# Patient Record
Sex: Male | Born: 1955 | Race: White | Hispanic: No | Marital: Married | Smoking: Never smoker
Health system: Southern US, Community
[De-identification: ages and names within clinical notes are randomized; demographics above are authoritative.]

## PROBLEM LIST (undated history)

## (undated) DIAGNOSIS — T7840XA Allergy, unspecified, initial encounter: Secondary | ICD-10-CM

## (undated) DIAGNOSIS — A4902 Methicillin resistant Staphylococcus aureus infection, unspecified site: Secondary | ICD-10-CM

## (undated) DIAGNOSIS — Z8619 Personal history of other infectious and parasitic diseases: Secondary | ICD-10-CM

## (undated) HISTORY — DX: Allergy, unspecified, initial encounter: T78.40XA

## (undated) HISTORY — DX: Personal history of other infectious and parasitic diseases: Z86.19

## (undated) HISTORY — DX: Methicillin resistant Staphylococcus aureus infection, unspecified site: A49.02

---

## 1993-03-12 HISTORY — PX: KNEE SURGERY: SHX244

## 2006-06-10 ENCOUNTER — Ambulatory Visit: Payer: Self-pay | Admitting: Infectious Diseases

## 2010-09-12 LAB — BASIC METABOLIC PANEL
Creatinine: 1 mg/dL (ref 0.6–1.3)
Glucose: 108 mg/dL

## 2010-09-12 LAB — LIPID PANEL: LDL Cholesterol: 110 mg/dL

## 2010-09-12 LAB — TSH: TSH: 3.4 u[IU]/mL (ref <=5.90)

## 2011-01-11 ENCOUNTER — Ambulatory Visit: Payer: Self-pay | Admitting: Family Medicine

## 2011-01-11 ENCOUNTER — Encounter: Payer: Self-pay | Admitting: Family Medicine

## 2011-01-11 ENCOUNTER — Ambulatory Visit (INDEPENDENT_AMBULATORY_CARE_PROVIDER_SITE_OTHER): Payer: PRIVATE HEALTH INSURANCE | Admitting: Family Medicine

## 2011-01-11 VITALS — BP 132/86 | HR 60 | Temp 98.3°F | Ht 71.0 in | Wt 192.1 lb

## 2011-01-11 DIAGNOSIS — Z1211 Encounter for screening for malignant neoplasm of colon: Secondary | ICD-10-CM

## 2011-01-11 DIAGNOSIS — Z0000 Encounter for general adult medical examination without abnormal findings: Secondary | ICD-10-CM | POA: Insufficient documentation

## 2011-01-11 NOTE — Assessment & Plan Note (Signed)
Healthy habits encouraged.  Continue exercise, healthy diet.  D/w patient [id number] for colon cancer screening, including IFOB vs. colonoscopy.  Risks and benefits of both were discussed and patient voiced understanding.  Pt elects for: colonoscopy. PSA prev normal per patient. Will review labs. DRE wnl.  Flu/shingles/td done at work.  Fu prn.

## 2011-01-11 NOTE — Patient Instructions (Addendum)
Drop off a copy of your labs.  I'll take a look at them.  Take care and keep exercising.   See Shirlee Limerick about your referral before your leave today. Glad to see you.

## 2011-01-11 NOTE — Progress Notes (Signed)
New pt.   CPE- See plan.  Routine anticipatory guidance given to patient.  See health maintenance.  Flu/shingles/td done at work.   PMH and SH reviewed  Meds, vitals, and allergies reviewed.   ROS: See HPI.  Otherwise negative.    GEN: nad, alert and oriented HEENT: mucous membranes moist NECK: supple w/o LA CV: rrr. PULM: ctab, no inc wob ABD: soft, +bs EXT: no edema SKIN: no acute rash Prostate gland firm and smooth, no enlargement, nodularity, tenderness, mass, asymmetry or induration.

## 2011-01-13 ENCOUNTER — Telehealth: Payer: Self-pay | Admitting: Family Medicine

## 2011-01-13 NOTE — Telephone Encounter (Signed)
Please call pt.  I checked over his labs.  All are fine except for mild inc in glucose.  I would recheck it yearly, this can be done through his labs at work.  No meds needed, treatment is only diet/exercise.  Thanks.

## 2011-01-15 ENCOUNTER — Encounter: Payer: Self-pay | Admitting: Family Medicine

## 2011-01-15 NOTE — Telephone Encounter (Signed)
Patient advised.

## 2011-06-19 ENCOUNTER — Encounter: Payer: Self-pay | Admitting: Family Medicine

## 2011-06-19 ENCOUNTER — Ambulatory Visit (INDEPENDENT_AMBULATORY_CARE_PROVIDER_SITE_OTHER): Payer: PRIVATE HEALTH INSURANCE | Admitting: Family Medicine

## 2011-06-19 DIAGNOSIS — H6990 Unspecified Eustachian tube disorder, unspecified ear: Secondary | ICD-10-CM

## 2011-06-19 DIAGNOSIS — H6980 Other specified disorders of Eustachian tube, unspecified ear: Secondary | ICD-10-CM

## 2011-06-19 DIAGNOSIS — J309 Allergic rhinitis, unspecified: Secondary | ICD-10-CM

## 2011-06-19 MED ORDER — FLUTICASONE PROPIONATE 50 MCG/ACT NA SUSP: 2.0000 | Freq: Every day | NASAL | Status: DC

## 2011-06-19 NOTE — Patient Instructions (Signed)
Nice to meet you. Let's add Sudafed for next few days- follow directions on bottle. Continue Benadryl. We are also going to add Flonase. Keep [place] posted with your symptoms.

## 2011-06-19 NOTE — Progress Notes (Signed)
  Subjective:    Patient ID: Daniel Cuevas, male    DOB: 1955/08/28, 56 y.o.   MRN: [id number]  HPI  56 yo pt of Dr. Para March here for "fluid on right ear." H/o allergic rhinitis- year round.  Takes Benadryl.  This time of years seems worse. More congestion, ears feel full, right >left for past several weeks on and off. No sinus pressure. No fever, no chills. No cough. No n/v/d. No loss of hearing or hearing pain.  Patient Active Problem List  Diagnoses  . Routine general medical examination at a health care facility   Past Medical History  Diagnosis Date  . History of chicken pox   . Allergy   . MRSA infection     in past   Past Surgical History  Procedure Date  . Knee surgery 1995    left, meniscus   History  Substance Use Topics  . Smoking status: Never Smoker   . Smokeless tobacco: Never Used  . Alcohol Use: Yes     occ   Family History  Problem Relation Age of Onset  . Cancer Mother     Breast  . Cancer Father     skin cancer  . Hyperlipidemia Father   . Hypertension Father   . Colon cancer Neg Hx   . Prostate cancer Neg Hx    No Known Allergies Current Outpatient Prescriptions on File Prior to Visit  Medication Sig Dispense Refill  . diphenhydrAMINE (BENADRYL) 25 MG tablet Take 25 mg by mouth every 6 (six) hours as needed.        . Multiple Vitamin (MULTIVITAMIN) tablet Take 1 tablet by mouth daily.        . fluticasone (FLONASE) 50 MCG/ACT nasal spray Place 2 sprays into the nose daily.  16 g  6   The PMH, PSH, Social History, Family History, Medications, and allergies have been reviewed in [facility], and have been updated if relevant.    Review of Systems See HPI    Objective:   Physical Exam BP 140/82  Pulse 68  Temp(Src) 97.9 F (36.6 C) (Oral)  Wt 190 lb (86.183 kg) General:  overweght male in NAD Eyes:  PERRL Ears:  External ear exam shows no significant lesions or deformities.  Pos fluid behind TMs bilaterally, right >left Nose:  External  nasal examination shows no deformity or inflammation. Pos erythema Mouth: pos PND Teeth in good repair. Neck:  no carotid bruit or thyromegaly no cervical or supraclavicular lymphadenopathy  Lungs:  Normal respiratory effort, chest expands symmetrically. Lungs are clear to auscultation, no crackles or wheezes. Heart:  Normal rate and regular rhythm. S1 and S2 normal without gallop, murmur, click, rub or other extra sounds. Pulses:  R and L posterior tibial pulses are full and equal bilaterally  Extremities:  no edema     Assessment & Plan:  1.  ETD with allergic rhinitis- Supportive care suggested. Continue antihistamine, add sudafed for short period of time. Will also add flonase. The patient indicates understanding of these issues and agrees with the plan.

## 2011-09-24 ENCOUNTER — Encounter: Payer: Self-pay | Admitting: Family Medicine

## 2011-09-24 ENCOUNTER — Ambulatory Visit (INDEPENDENT_AMBULATORY_CARE_PROVIDER_SITE_OTHER): Payer: PRIVATE HEALTH INSURANCE | Admitting: Family Medicine

## 2011-09-24 VITALS — BP 122/80 | HR 53 | Temp 98.4°F | Wt 187.0 lb

## 2011-09-24 DIAGNOSIS — R6889 Other general symptoms and signs: Secondary | ICD-10-CM

## 2011-09-24 DIAGNOSIS — R7989 Other specified abnormal findings of blood chemistry: Secondary | ICD-10-CM

## 2011-09-24 NOTE — Patient Instructions (Addendum)
Have them recheck your TSH in about 3 months and please mail me a copy of the results.  Take care.  If you notice a neck mass or profound fatigue, let me know.

## 2011-09-24 NOTE — Progress Notes (Signed)
Abnormal TSH on routine testing at work.  No trouble swallowing, neck mass or pain, voice changes.  No FH or personal history of thyroid disease.  No sx or hyper or hypothyroidism.    Feels well.  Meds, vitals, and allergies reviewed.   ROS: See HPI.  Otherwise, noncontributory.  nad ncat Neck supple, no LA, no TMG, no mass, no bruit

## 2011-09-25 DIAGNOSIS — R7989 Other specified abnormal findings of blood chemistry: Secondary | ICD-10-CM | POA: Insufficient documentation

## 2011-09-25 NOTE — Assessment & Plan Note (Signed)
See scanned forms.  With normal TFTs o/w. TSH 5.3.  Would recheck in 3 months.  He'll do this via work lab and I asked him to send me a copy.  At this level, if persistent, would only follow and not treat.  F/u prn.  He agrees.

## 2012-05-15 ENCOUNTER — Ambulatory Visit (INDEPENDENT_AMBULATORY_CARE_PROVIDER_SITE_OTHER): Payer: BC Managed Care – PPO | Admitting: Family Medicine

## 2012-05-15 ENCOUNTER — Encounter: Payer: Self-pay | Admitting: Family Medicine

## 2012-05-15 VITALS — BP 128/80 | HR 59 | Temp 98.3°F | Wt 194.0 lb

## 2012-05-15 DIAGNOSIS — B369 Superficial mycosis, unspecified: Secondary | ICD-10-CM

## 2012-05-15 DIAGNOSIS — R7989 Other specified abnormal findings of blood chemistry: Secondary | ICD-10-CM

## 2012-05-15 DIAGNOSIS — J309 Allergic rhinitis, unspecified: Secondary | ICD-10-CM

## 2012-05-15 DIAGNOSIS — B49 Unspecified mycosis: Secondary | ICD-10-CM

## 2012-05-15 DIAGNOSIS — R6889 Other general symptoms and signs: Secondary | ICD-10-CM

## 2012-05-15 MED ORDER — FLUCONAZOLE 150 MG PO TABS: 150.0000 mg | ORAL_TABLET | Freq: Once | ORAL | Status: DC

## 2012-05-15 MED ORDER — FLUTICASONE PROPIONATE 50 MCG/ACT NA SUSP: 2.0000 | Freq: Every day | NASAL | Status: AC

## 2012-05-15 NOTE — Progress Notes (Signed)
Was started on flonase last year and had good improvement in allergy sx, had dec in benadryl need and sx were controlled with less rhinorrhea.  A few months ago, had dec in hearing in L ear noted.  Restarted on flonase and had hearing retested at ENT clinic, normalized per patient.   Now with rash on buttock crease. He's tried OTC antifungals and hydrocortisone for the itching.  Going on for 4-5 months, with episodic flares.   He had his f/u TSH done last year and was normal per patient.    Meds, vitals, and allergies reviewed.   ROS: See HPI.  Otherwise, noncontributory.  nad ncat Tm wnl L nostril with small resolved nosebleed, adherent blood.  Nasal exam wnl o/w.  Gluteal crease with B superficial erythema c/w superficial fungal infection .

## 2012-05-15 NOTE — Patient Instructions (Addendum)
The diflucan and then talc to stay dry. Keep using the flonase.  Take care.  Glad to see you.

## 2012-05-16 DIAGNOSIS — B369 Superficial mycosis, unspecified: Secondary | ICD-10-CM | POA: Insufficient documentation

## 2012-05-16 DIAGNOSIS — J309 Allergic rhinitis, unspecified: Secondary | ICD-10-CM | POA: Insufficient documentation

## 2012-05-16 NOTE — Assessment & Plan Note (Signed)
Normalized on recheck 

## 2012-05-16 NOTE — Assessment & Plan Note (Signed)
Would use diflucan q3-4 days until resolved and then talc locally.

## 2012-05-16 NOTE — Assessment & Plan Note (Signed)
Refilled flonase.  Nosebleed cautions d/w pt.  Overall much improved on flonase.

## 2012-05-22 ENCOUNTER — Telehealth: Payer: Self-pay

## 2012-05-22 MED ORDER — CLOTRIMAZOLE-BETAMETHASONE 1-0.05 % EX CREA: TOPICAL_CREAM | Freq: Two times a day (BID) | CUTANEOUS | Status: DC

## 2012-05-22 NOTE — Telephone Encounter (Signed)
Patient advised.

## 2012-05-22 NOTE — Telephone Encounter (Signed)
I would try lotrisone, I sent the rx.  If not improved, then notify me.  Thanks.

## 2012-05-22 NOTE — Telephone Encounter (Signed)
Pt seen 05/15/12; rash on crease of buttock not any better. Pt has taken the 4 diflucan pills 1 1/2 days apart with no real results. Pt request different med sent to [facility].Please advise.

## 2012-08-13 ENCOUNTER — Ambulatory Visit: Payer: Self-pay | Admitting: General Surgery

## 2012-09-10 ENCOUNTER — Encounter: Payer: Self-pay | Admitting: General Surgery

## 2012-09-10 ENCOUNTER — Ambulatory Visit (INDEPENDENT_AMBULATORY_CARE_PROVIDER_SITE_OTHER): Payer: BC Managed Care – PPO | Admitting: General Surgery

## 2012-09-10 VITALS — BP 132/76 | HR 74 | Resp 12 | Ht 71.0 in | Wt 190.0 lb

## 2012-09-10 DIAGNOSIS — Z1211 Encounter for screening for malignant neoplasm of colon: Secondary | ICD-10-CM | POA: Insufficient documentation

## 2012-09-10 NOTE — Progress Notes (Signed)
Patient ID: Daniel Cuevas, male   DOB: 04-02-1955, 57 y.o.   MRN: [id number]  Chief Complaint  Patient presents with  . Other    colonoscopy    HPI Daniel Cuevas is a 57 y.o. male here today for an screening colonoscopy non prior. Patient states he has no GI problems. HPI  Past Medical History  Diagnosis Date  . History of chicken pox   . Allergy   . MRSA infection     in past    Past Surgical History  Procedure Laterality Date  . Knee surgery  1995    left, meniscus    Family History  Problem Relation Age of Onset  . Cancer Mother     Breast  . Cancer Father     skin cancer, died of Merkel cell cancer passed at age 87  . Hyperlipidemia Father   . Hypertension Father   . Colon cancer Neg Hx   . Prostate cancer Neg Hx     Social History History  Substance Use Topics  . Smoking status: Never Smoker   . Smokeless tobacco: Never Used  . Alcohol Use: Yes     Comment: occ    No Known Allergies  Current Outpatient Prescriptions  Medication Sig Dispense Refill  . diphenhydrAMINE (BENADRYL) 25 MG tablet Take 25 mg by mouth every 6 (six) hours as needed.        . fluticasone (FLONASE) 50 MCG/ACT nasal spray Place 2 sprays into the nose daily.  48 g  3  . Multiple Vitamin (MULTIVITAMIN) tablet Take 1 tablet by mouth daily.         No current facility-administered medications for this visit.    Review of Systems Review of Systems  Constitutional: Negative.   Respiratory: Negative.   Cardiovascular: Negative.   Gastrointestinal: Negative.     Blood pressure 132/76, pulse 74, resp. rate 12, height 5\' 11"  (1.803 m), weight 190 lb (86.183 kg).  Physical Exam Physical Exam  Constitutional: He is oriented to person, place, and time. He appears well-developed and well-nourished.  Cardiovascular: Normal rate, regular rhythm and normal heart sounds.   Pulmonary/Chest: Breath sounds normal.  Neurological: He is alert and oriented to person, place, and time.  Skin: Skin  is warm and dry.    Data Reviewed None.  Assessment    Candidate for screening colonoscopy.     Plan    Indication for the procedure was reviewed. Risks and benefits discussed.     Patient will be contacted once September 2014 schedule is available. Miralax prescription will be sent to patient's pharmacy once date arranged.     Earline Mayotte 09/10/2012, 8:33 PM

## 2012-09-10 NOTE — Patient Instructions (Addendum)
Colonoscopy A colonoscopy is an exam to evaluate your entire colon. In this exam, your colon is cleansed. A long fiberoptic tube is inserted through your rectum and into your colon. The fiberoptic scope (endoscope) is a long bundle of enclosed and very flexible fibers. These fibers transmit light to the area examined and send images from that area to your caregiver. Discomfort is usually minimal. You may be given a drug to help you sleep (sedative) during or prior to the procedure. This exam helps to detect lumps (tumors), polyps, inflammation, and areas of bleeding. Your caregiver may also take a small piece of tissue (biopsy) that will be examined under a microscope. LET YOUR CAREGIVER KNOW ABOUT:   Allergies to food or medicine.  Medicines taken, including vitamins, herbs, eyedrops, over-the-counter medicines, and creams.  Use of steroids (by mouth or creams).  Previous problems with anesthetics or numbing medicines.  History of bleeding problems or blood clots.  Previous surgery.  Other health problems, including diabetes and kidney problems.  Possibility of pregnancy, if this applies. BEFORE THE PROCEDURE   A clear liquid diet may be required for 2 days before the exam.  Ask your caregiver about changing or stopping your regular medications.  Liquid injections (enemas) or laxatives may be required.  A large amount of electrolyte solution may be given to you to drink over a short period of time. This solution is used to clean out your colon.  You should be present 60 minutes prior to your procedure or as directed by your caregiver. AFTER THE PROCEDURE   If you received a sedative or pain relieving medication, you will need to arrange for someone to drive you home.  Occasionally, there is a little blood passed with the first bowel movement. Do not be concerned. FINDING OUT THE RESULTS OF YOUR TEST Not all test results are available during your visit. If your test results are  not back during the visit, make an appointment with your caregiver to find out the results. Do not assume everything is normal if you have not heard from your caregiver or the medical facility. It is important for you to follow up on all of your test results. HOME CARE INSTRUCTIONS   It is not unusual to pass moderate amounts of gas and experience mild abdominal cramping following the procedure. This is due to air being used to inflate your colon during the exam. Walking or a warm pack on your belly (abdomen) may help.  You may resume all normal meals and activities after sedatives and medicines have worn off.  Only take over-the-counter or prescription medicines for pain, discomfort, or fever as directed by your caregiver. Do not use aspirin or blood thinners if a biopsy was taken. Consult your caregiver for medicine usage if biopsies were taken. SEEK IMMEDIATE MEDICAL CARE IF:   You have a fever.  You pass large blood clots or fill a toilet with blood following the procedure. This may also occur 10 to 14 days following the procedure. This is more likely if a biopsy was taken.  You develop abdominal pain that keeps getting worse and cannot be relieved with medicine. Document Released: 02/24/2000 Document Revised: 05/21/2011 Document Reviewed: 10/09/2007 [facility] Patient Information 2014 [place], [place].  Patient will be contacted once September 2014 schedule is available. Miralax prescription will be sent to patient's pharmacy once date arranged.

## 2012-10-09 ENCOUNTER — Telehealth: Payer: Self-pay | Admitting: *Deleted

## 2012-10-09 DIAGNOSIS — Z1211 Encounter for screening for malignant neoplasm of colon: Secondary | ICD-10-CM

## 2012-10-09 MED ORDER — POLYETHYLENE GLYCOL 3350 17 GM/SCOOP PO POWD: ORAL | Status: DC

## 2012-10-09 NOTE — Telephone Encounter (Signed)
Patient was contacted today to arrange colonoscopy. This has been scheduled for 11-26-12 at [facility]. Miralax prescription has been sent to his pharmacy. He will be contacted prior to procedure to verify no medication changes.

## 2012-11-19 ENCOUNTER — Telehealth: Payer: Self-pay | Admitting: *Deleted

## 2012-11-19 NOTE — Telephone Encounter (Signed)
Patient reports no medication changes since last office visit. He was instructed to pre-register since he has not done so already. We will proceed with colonoscopy that is scheduled at [facility] for 11-26-12. Patient to call the office if he has further questions.

## 2012-11-23 ENCOUNTER — Other Ambulatory Visit: Payer: Self-pay | Admitting: General Surgery

## 2012-11-23 DIAGNOSIS — Z1211 Encounter for screening for malignant neoplasm of colon: Secondary | ICD-10-CM

## 2012-11-26 ENCOUNTER — Ambulatory Visit: Payer: Self-pay | Admitting: General Surgery

## 2012-11-26 DIAGNOSIS — Z1211 Encounter for screening for malignant neoplasm of colon: Secondary | ICD-10-CM

## 2012-11-27 ENCOUNTER — Encounter: Payer: Self-pay | Admitting: General Surgery

## 2013-05-15 ENCOUNTER — Encounter: Payer: Self-pay | Admitting: Family Medicine

## 2013-05-15 ENCOUNTER — Ambulatory Visit (INDEPENDENT_AMBULATORY_CARE_PROVIDER_SITE_OTHER): Payer: BC Managed Care – PPO | Admitting: Family Medicine

## 2013-05-15 VITALS — BP 142/90 | HR 52 | Temp 97.7°F | Wt 193.5 lb

## 2013-05-15 DIAGNOSIS — M7020 Olecranon bursitis, unspecified elbow: Secondary | ICD-10-CM

## 2013-05-15 MED ORDER — CLOTRIMAZOLE-BETAMETHASONE 1-0.05 % EX CREA: TOPICAL_CREAM | Freq: Two times a day (BID) | CUTANEOUS | Status: DC

## 2013-05-15 NOTE — Patient Instructions (Signed)
I would give this longer to heal up.  If it is persisting, ortho may take the bursa out.  We can refer you if needed.  I wouldn't inject it or drain it today.

## 2013-05-15 NOTE — Progress Notes (Signed)
Pre visit review using our clinic review tool, if applicable. No additional management support is needed unless otherwise documented below in the visit note.  Fell on his elbow. Elbow was puffy and red.  Put on abx per an outside clinic.  Off abx for about 1 week.  Still has normal ROM. Pocket of fluid persists.  No pain unless he hits the area. It is slightly smaller now.    Meds, vitals, and allergies reviewed.   ROS: See HPI.  Otherwise, noncontributory.  nad L shoulder elbow and wrist with normal ROM L olecranon bursa puffy but not ttp, not red.  Distally nv intact

## 2013-05-16 DIAGNOSIS — M7020 Olecranon bursitis, unspecified elbow: Secondary | ICD-10-CM | POA: Insufficient documentation

## 2013-05-16 NOTE — Assessment & Plan Note (Addendum)
Doesn't appear infected, some smaller recently. Would not intervene other than protection, avoiding reinjury.  Should resolve. We can refer if persisting. D/w pt.

## 2015-06-14 ENCOUNTER — Ambulatory Visit (INDEPENDENT_AMBULATORY_CARE_PROVIDER_SITE_OTHER): Payer: BLUE CROSS/BLUE SHIELD | Admitting: Family Medicine

## 2015-06-14 ENCOUNTER — Encounter: Payer: Self-pay | Admitting: Family Medicine

## 2015-06-14 VITALS — BP 116/80 | HR 58 | Temp 98.4°F | Wt 189.2 lb

## 2015-06-14 DIAGNOSIS — L989 Disorder of the skin and subcutaneous tissue, unspecified: Secondary | ICD-10-CM | POA: Diagnosis not present

## 2015-06-14 MED ORDER — CLOTRIMAZOLE-BETAMETHASONE 1-0.05 % EX CREA: 1.0000 "application " | TOPICAL_CREAM | Freq: Two times a day (BID) | CUTANEOUS | Status: AC

## 2015-06-14 NOTE — Patient Instructions (Addendum)
Daniel Cuevas will call about your referral. Use the cream in the meantime.  Take care.  Glad to see you.

## 2015-06-14 NOTE — Progress Notes (Signed)
Pre visit review using our clinic review tool, if applicable. No additional management support is needed unless otherwise documented below in the visit note.  Scalp is getting red in patches.  Irritated area on the posterior scalp.  occ wears a hat.  Some itching and redness.  Used an antifungal spray with some relief.  Present for about 1.5 months.  Itches but not painful.  No other similar skin lesions.    He wanted a referral for derm.    Meds, vitals, and allergies reviewed.   ROS: See HPI.  Otherwise, noncontributory.  nad ncat but flaky red irregular blanching lesions noted on the scalp, more so on the posterior scalp, less prominent on the anterior border of the scalp.  No ulceration.  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

## 2015-06-15 DIAGNOSIS — L989 Disorder of the skin and subcutaneous tissue, unspecified: Secondary | ICD-10-CM | POA: Insufficient documentation

## 2015-06-15 NOTE — Assessment & Plan Note (Signed)
Refer to derm re: possible AKs and per his request in general.  Likely with fungal infection on the scalp.  Add on steroid for itching, with lotrisone to cover common fungal species.  Update me as needed.

## 2015-06-17 DIAGNOSIS — J3081 Allergic rhinitis due to animal (cat) (dog) hair and dander: Secondary | ICD-10-CM | POA: Diagnosis not present

## 2015-06-17 DIAGNOSIS — J3089 Other allergic rhinitis: Secondary | ICD-10-CM | POA: Diagnosis not present

## 2015-06-17 DIAGNOSIS — J301 Allergic rhinitis due to pollen: Secondary | ICD-10-CM | POA: Diagnosis not present

## 2015-07-14 DIAGNOSIS — J3081 Allergic rhinitis due to animal (cat) (dog) hair and dander: Secondary | ICD-10-CM | POA: Diagnosis not present

## 2015-07-14 DIAGNOSIS — J3089 Other allergic rhinitis: Secondary | ICD-10-CM | POA: Diagnosis not present

## 2015-07-14 DIAGNOSIS — J301 Allergic rhinitis due to pollen: Secondary | ICD-10-CM | POA: Diagnosis not present

## 2015-07-18 DIAGNOSIS — L309 Dermatitis, unspecified: Secondary | ICD-10-CM | POA: Diagnosis not present

## 2015-07-18 DIAGNOSIS — L280 Lichen simplex chronicus: Secondary | ICD-10-CM | POA: Diagnosis not present

## 2015-07-18 DIAGNOSIS — L218 Other seborrheic dermatitis: Secondary | ICD-10-CM | POA: Diagnosis not present

## 2015-07-18 DIAGNOSIS — L281 Prurigo nodularis: Secondary | ICD-10-CM | POA: Diagnosis not present

## 2015-07-21 DIAGNOSIS — J3089 Other allergic rhinitis: Secondary | ICD-10-CM | POA: Diagnosis not present

## 2015-07-21 DIAGNOSIS — J301 Allergic rhinitis due to pollen: Secondary | ICD-10-CM | POA: Diagnosis not present

## 2015-07-21 DIAGNOSIS — J3081 Allergic rhinitis due to animal (cat) (dog) hair and dander: Secondary | ICD-10-CM | POA: Diagnosis not present

## 2015-09-26 DIAGNOSIS — L570 Actinic keratosis: Secondary | ICD-10-CM | POA: Diagnosis not present

## 2015-11-04 DIAGNOSIS — J3081 Allergic rhinitis due to animal (cat) (dog) hair and dander: Secondary | ICD-10-CM | POA: Diagnosis not present

## 2015-11-04 DIAGNOSIS — J3089 Other allergic rhinitis: Secondary | ICD-10-CM | POA: Diagnosis not present

## 2015-11-04 DIAGNOSIS — J301 Allergic rhinitis due to pollen: Secondary | ICD-10-CM | POA: Diagnosis not present

## 2015-12-22 DIAGNOSIS — J3089 Other allergic rhinitis: Secondary | ICD-10-CM | POA: Diagnosis not present

## 2015-12-22 DIAGNOSIS — J301 Allergic rhinitis due to pollen: Secondary | ICD-10-CM | POA: Diagnosis not present

## 2015-12-22 DIAGNOSIS — J3081 Allergic rhinitis due to animal (cat) (dog) hair and dander: Secondary | ICD-10-CM | POA: Diagnosis not present

## 2015-12-29 DIAGNOSIS — J3089 Other allergic rhinitis: Secondary | ICD-10-CM | POA: Diagnosis not present

## 2015-12-29 DIAGNOSIS — J301 Allergic rhinitis due to pollen: Secondary | ICD-10-CM | POA: Diagnosis not present

## 2015-12-29 DIAGNOSIS — J3081 Allergic rhinitis due to animal (cat) (dog) hair and dander: Secondary | ICD-10-CM | POA: Diagnosis not present

## 2016-01-12 DIAGNOSIS — J3089 Other allergic rhinitis: Secondary | ICD-10-CM | POA: Diagnosis not present

## 2016-01-12 DIAGNOSIS — J3081 Allergic rhinitis due to animal (cat) (dog) hair and dander: Secondary | ICD-10-CM | POA: Diagnosis not present

## 2016-01-12 DIAGNOSIS — J301 Allergic rhinitis due to pollen: Secondary | ICD-10-CM | POA: Diagnosis not present

## 2016-01-26 DIAGNOSIS — J301 Allergic rhinitis due to pollen: Secondary | ICD-10-CM | POA: Diagnosis not present

## 2016-01-26 DIAGNOSIS — J3081 Allergic rhinitis due to animal (cat) (dog) hair and dander: Secondary | ICD-10-CM | POA: Diagnosis not present

## 2016-01-26 DIAGNOSIS — J3089 Other allergic rhinitis: Secondary | ICD-10-CM | POA: Diagnosis not present

## 2016-02-09 DIAGNOSIS — J3089 Other allergic rhinitis: Secondary | ICD-10-CM | POA: Diagnosis not present

## 2016-02-09 DIAGNOSIS — J3081 Allergic rhinitis due to animal (cat) (dog) hair and dander: Secondary | ICD-10-CM | POA: Diagnosis not present

## 2016-02-09 DIAGNOSIS — J301 Allergic rhinitis due to pollen: Secondary | ICD-10-CM | POA: Diagnosis not present

## 2016-04-12 DIAGNOSIS — L821 Other seborrheic keratosis: Secondary | ICD-10-CM | POA: Diagnosis not present

## 2016-04-12 DIAGNOSIS — L570 Actinic keratosis: Secondary | ICD-10-CM | POA: Diagnosis not present

## 2016-06-07 DIAGNOSIS — L509 Urticaria, unspecified: Secondary | ICD-10-CM | POA: Diagnosis not present

## 2016-06-07 DIAGNOSIS — J301 Allergic rhinitis due to pollen: Secondary | ICD-10-CM | POA: Diagnosis not present

## 2016-06-07 DIAGNOSIS — J3081 Allergic rhinitis due to animal (cat) (dog) hair and dander: Secondary | ICD-10-CM | POA: Diagnosis not present

## 2016-06-07 DIAGNOSIS — J3089 Other allergic rhinitis: Secondary | ICD-10-CM | POA: Diagnosis not present

## 2016-07-01 DIAGNOSIS — S61012A Laceration without foreign body of left thumb without damage to nail, initial encounter: Secondary | ICD-10-CM | POA: Diagnosis not present

## 2016-07-02 ENCOUNTER — Encounter: Payer: Self-pay | Admitting: Family Medicine

## 2016-07-02 ENCOUNTER — Ambulatory Visit (INDEPENDENT_AMBULATORY_CARE_PROVIDER_SITE_OTHER): Payer: BLUE CROSS/BLUE SHIELD | Admitting: Family Medicine

## 2016-07-02 DIAGNOSIS — S61012D Laceration without foreign body of left thumb without damage to nail, subsequent encounter: Secondary | ICD-10-CM | POA: Diagnosis not present

## 2016-07-02 NOTE — Progress Notes (Signed)
Was cutting lettuce 2 days ago.  Nicked his L thumb, he lost part of the skin with the injury.  Seen at [facility] yesterday.  Was rec'd to come in today to check up, for routine f/u.  It didn't hurt initially.  More sore in the meantime.  He didn't have to have intervention other than pressure locally, but it took about a half hour of direct pressure to get the bleeding to stop. He did not require cautery. There was not enough tissue left to suture.  Meds, vitals, and allergies reviewed.   ROS: Per HPI unless specifically indicated in ROS section   nad L hand with normal inspection except for L distal thumb with V-shaped defect. It oozed a drop or two of blood but otherwise was not bleeding. There was some adherent clot/scab on the end of the finger. He has good capillary refill. No tendon deficit. Recovered with Neosporin and nonstick bandage. Wrapped up in coban.

## 2016-07-02 NOTE — Patient Instructions (Signed)
Start the keflex and keep it covered.  Use a nonstick bandage and update [place] as needed.  Don't use peroxide.  When it is getting better, then wash gently with soap and water, then rebandage it.

## 2016-07-02 NOTE — Progress Notes (Signed)
Pre visit review using our clinic review tool, if applicable. No additional management support is needed unless otherwise documented below in the visit note. 

## 2016-07-03 DIAGNOSIS — S61019A Laceration without foreign body of unspecified thumb without damage to nail, initial encounter: Secondary | ICD-10-CM | POA: Insufficient documentation

## 2016-07-03 NOTE — Assessment & Plan Note (Signed)
Agree with not suturing. This is going to have to heal by secondary intent. Continue with daily dressing changes. Routine cautions given to patient. He was previously given a prescription for Keflex and this is reasonable to use. Does not look infected. Update me as needed. Routine cautions given. He agrees.

## 2016-09-24 DIAGNOSIS — L738 Other specified follicular disorders: Secondary | ICD-10-CM | POA: Diagnosis not present

## 2016-09-24 DIAGNOSIS — L281 Prurigo nodularis: Secondary | ICD-10-CM | POA: Diagnosis not present

## 2016-09-24 DIAGNOSIS — L820 Inflamed seborrheic keratosis: Secondary | ICD-10-CM | POA: Diagnosis not present

## 2017-05-21 DIAGNOSIS — J301 Allergic rhinitis due to pollen: Secondary | ICD-10-CM | POA: Diagnosis not present

## 2017-05-21 DIAGNOSIS — J3081 Allergic rhinitis due to animal (cat) (dog) hair and dander: Secondary | ICD-10-CM | POA: Diagnosis not present

## 2017-05-21 DIAGNOSIS — J3089 Other allergic rhinitis: Secondary | ICD-10-CM | POA: Diagnosis not present

## 2017-05-21 DIAGNOSIS — L509 Urticaria, unspecified: Secondary | ICD-10-CM | POA: Diagnosis not present

## 2017-07-16 ENCOUNTER — Encounter (INDEPENDENT_AMBULATORY_CARE_PROVIDER_SITE_OTHER): Payer: Self-pay | Admitting: Orthopaedic Surgery

## 2017-07-16 ENCOUNTER — Ambulatory Visit (INDEPENDENT_AMBULATORY_CARE_PROVIDER_SITE_OTHER): Payer: Self-pay

## 2017-07-16 ENCOUNTER — Ambulatory Visit (INDEPENDENT_AMBULATORY_CARE_PROVIDER_SITE_OTHER): Payer: BLUE CROSS/BLUE SHIELD | Admitting: Orthopaedic Surgery

## 2017-07-16 VITALS — BP 151/85 | HR 69 | Resp 16 | Ht 71.0 in | Wt 190.0 lb

## 2017-07-16 DIAGNOSIS — M25521 Pain in right elbow: Secondary | ICD-10-CM

## 2017-07-16 MED ORDER — LIDOCAINE HCL 1 % IJ SOLN
1.0000 mL | INTRAMUSCULAR | Status: AC | PRN
  Administered 2017-07-16: 1 mL

## 2017-07-16 MED ORDER — METHYLPREDNISOLONE ACETATE 40 MG/ML IJ SUSP
20.0000 mg | INTRAMUSCULAR | Status: AC | PRN
  Administered 2017-07-16: 20 mg

## 2017-07-16 NOTE — Patient Instructions (Signed)
Tennis Elbow Tennis elbow is puffiness (inflammation) of the outer tendons of your forearm close to your elbow. Your tendons attach your muscles to your bones. Tennis elbow can happen in any sport or job in which you use your elbow too much. It is caused by doing the same motion over and over. Tennis elbow can cause:  Pain and tenderness in your forearm and the outer part of your elbow.  A burning feeling. This runs from your elbow through your arm.  Weak grip in your hands.  Follow these instructions at home: Activity  Rest your elbow and wrist as told by your doctor. Try to avoid any activities that caused the problem until your doctor says that you can do them again.  If a physical therapist teaches you exercises, do all of them as told.  If you lift an object, lift it with your palm facing up. This is easier on your elbow. Lifestyle  If your tennis elbow is caused by sports, check your equipment and make sure that: ? You are using it correctly. ? It fits you well.  If your tennis elbow is caused by work, take breaks often, if you are able. Talk with your manager about doing your work in a way that is safe for you. ? If your tennis elbow is caused by computer use, talk with your manager about any changes that can be made to your work setup. General instructions  If told, apply ice to the painful area: ? Put ice in a plastic bag. ? Place a towel between your skin and the bag. ? Leave the ice on for 20 minutes, 2-3 times per day.  Take medicines only as told by your doctor.  If you were given a brace, wear it as told by your doctor.  Keep all follow-up visits as told by your doctor. This is important. Contact a doctor if:  Your pain does not get better with treatment.  Your pain gets worse.  You have weakness in your forearm, hand, or fingers.  You cannot feel your forearm, hand, or fingers. This information is not intended to replace advice given to you by your health  care provider. Make sure you discuss any questions you have with your health care provider. Document Released: 08/16/2009 Document Revised: 10/27/2015 Document Reviewed: 02/22/2014 Elsevier Interactive Patient Education  2018 [employer] Inc[employer]  

## 2017-07-16 NOTE — Progress Notes (Signed)
Office Visit Note   Patient: Daniel Cuevas           Date of Birth: March 21, 1955 (age 62)           MRN: [id number] Visit Date: 07/16/2017              Requested by: Joaquim Nam, MD [address] [place], [place] [address] PCP: Joaquim Nam, MD   Assessment & Plan: Visit Diagnoses:  1. 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  Will inject with cortisone.  Tennis elbow splint and exercises.  Return as needed  Follow-Up Instructions: Return if symptoms worsen or fail to improve.   Orders:  Orders Placed This Encounter  Procedures  . Hand/UE Inj: R elbow  . XR Elbow 2 Views Right   No orders of the defined types were placed in this encounter.     Procedures: Hand/UE Inj: R elbow for lateral epicondylitis on 07/16/2017 2:21 PM Medications: 1 mL lidocaine 1 %; 20 mg methylPREDNISolone acetate 40 MG/ML      Clinical Data: No additional findings.   Subjective: Chief Complaint  Patient presents with  . Right Elbow - Pain  . New Patient (Initial Visit)    R ELBOW PAIN NO INJRY, SOME NUMBNESS WHEN USED A LOT  [name] office with the several week history of lateral right elbow pain.  He has been previously diagnosed with tennis elbow several years ago and responded nicely to a cortisone injection.  He is recently had increased pain after working in the yard.  Realized to the lateral elbow.  No skin changes.  No numbness or tingling.  HPI  Review of Systems  Constitutional: Negative for fatigue and fever.  HENT: Negative for ear pain.   Eyes: Negative for pain.  Respiratory: Negative for cough and shortness of breath.   Cardiovascular: Negative for leg swelling.  Gastrointestinal: Negative for constipation and diarrhea.  Genitourinary: Negative for difficulty urinating.  Musculoskeletal: Negative for back pain and neck pain.  Skin: Negative for rash.  Allergic/Immunologic: Negative for food allergies.  Neurological:  Positive for weakness and numbness.  Hematological: Does not bruise/bleed easily.  Psychiatric/Behavioral: Negative for sleep disturbance.     Objective: Vital Signs: BP (!) 151/85 (BP Location: Left Arm, Patient Position: Sitting, Cuff Size: Normal)   Pulse 69   Resp 16   Ht  (1.803 m)   Wt 190 lb (86.2 kg)   BMI 26.50 kg/m   Physical Exam  Constitutional: He is oriented to person, place, and time. He appears well-developed and well-nourished.  HENT:  Mouth/Throat: Oropharynx is clear and moist.  Eyes: Pupils are equal, round, and reactive to light. EOM are normal.  Pulmonary/Chest: Effort normal.  Neurological: He is alert and oriented to person, place, and time.  Skin: Skin is warm and dry.  Psychiatric: He has a normal mood and affect. His behavior is normal.    Ortho Exam awake alert and oriented x3.  Comfortable sitting.  Pain localized directly over the lateral epicondyle right elbow.  Pain with grip more in extension and flexion.  Full range of motion.  Skin intact without erythema or ecchymosis.  Specialty Comments:  No specialty comments available.  Imaging: Xr Elbow 2 Views Right  Result Date: 07/16/2017 Films of the right elbow were obtained in the AP and lateral projection.  No ectopic calcification.  No evidence of arthritis.  No fat pad  sign.    PMFS History: Patient Active Problem List   Diagnosis Date Noted  . Thumb laceration 07/03/2016  . Skin lesion 06/15/2015  . Olecranon bursitis 05/16/2013  . Encounter for screening colonoscopy 09/10/2012  . Allergic rhinitis 05/16/2012  . Fungal infection of skin 05/16/2012  . Abnormal TSH 09/25/2011  . Routine general medical examination at a health care facility 01/11/2011   Past Medical History:  Diagnosis Date  . Allergy   . History of chicken pox   . MRSA infection    in past    Family History  Problem Relation Age of Onset  . Cancer Mother        Breast  . Cancer Father        skin  cancer, died of Merkel cell cancer passed at age 60  . Hyperlipidemia Father   . Hypertension Father   . Colon cancer Neg Hx   . Prostate cancer Neg Hx     Past Surgical History:  Procedure Laterality Date  . KNEE SURGERY  1995   left, meniscus   Social History   Occupational History  . Occupation: Firefighter: [employer]  Tobacco Use  . Smoking status: Never Smoker  . Smokeless tobacco: Never Used  Substance and Sexual Activity  . Alcohol use: Yes    Comment: occ  . Drug use: No  . Sexual activity: Not on file

## 2017-07-19 ENCOUNTER — Ambulatory Visit (INDEPENDENT_AMBULATORY_CARE_PROVIDER_SITE_OTHER): Payer: Self-pay | Admitting: Orthopaedic Surgery

## 2017-09-18 LAB — LAB REPORT - SCANNED
A1c: 5.4
ALT: 14
AST: 23
CREATININE: 1.02
Cholesterol: 213
Glucose: 101
HDL: 63
Hemoglobin: 14.5
LDL (calc): 126
Prostate Specific Ag, Serum: 1.8
TSH: 4.17
Triglycerides: 122
Uric Acid: 5.6
VIT D 25 HYDROXY: 25.7

## 2017-09-24 ENCOUNTER — Encounter: Payer: Self-pay | Admitting: *Deleted

## 2017-09-24 ENCOUNTER — Encounter: Payer: Self-pay | Admitting: Family Medicine

## 2018-01-23 DIAGNOSIS — J301 Allergic rhinitis due to pollen: Secondary | ICD-10-CM | POA: Diagnosis not present

## 2018-01-23 DIAGNOSIS — L509 Urticaria, unspecified: Secondary | ICD-10-CM | POA: Diagnosis not present

## 2018-01-23 DIAGNOSIS — J3081 Allergic rhinitis due to animal (cat) (dog) hair and dander: Secondary | ICD-10-CM | POA: Diagnosis not present

## 2018-01-23 DIAGNOSIS — J3089 Other allergic rhinitis: Secondary | ICD-10-CM | POA: Diagnosis not present

## 2019-01-01 DIAGNOSIS — J301 Allergic rhinitis due to pollen: Secondary | ICD-10-CM | POA: Diagnosis not present

## 2019-01-01 DIAGNOSIS — J3081 Allergic rhinitis due to animal (cat) (dog) hair and dander: Secondary | ICD-10-CM | POA: Diagnosis not present

## 2019-01-01 DIAGNOSIS — L509 Urticaria, unspecified: Secondary | ICD-10-CM | POA: Diagnosis not present

## 2019-01-01 DIAGNOSIS — J3089 Other allergic rhinitis: Secondary | ICD-10-CM | POA: Diagnosis not present

## 2021-07-27 ENCOUNTER — Ambulatory Visit: Payer: BC Managed Care – PPO | Admitting: Sports Medicine

## 2021-07-27 VITALS — BP 110/70 | HR 54 | Ht 71.0 in | Wt 195.0 lb

## 2021-07-27 DIAGNOSIS — M25561 Pain in right knee: Secondary | ICD-10-CM

## 2021-07-27 MED ORDER — MELOXICAM 15 MG PO TABS: 15.0000 mg | ORAL_TABLET | Freq: Every day | ORAL | 0 refills | Status: DC

## 2021-07-27 NOTE — Progress Notes (Signed)
Daniel Cuevas D.[facility] [address] [place] [address] Phone: [phone]   Assessment and Plan:     1. 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  No concerning findings on physical exam with patient having moderate improvement after 1 dose of ibuprofen - Start meloxicam 15 mg daily x2 weeks.  If still having pain after 2 weeks, complete 3rd-week of meloxicam. May use remaining meloxicam as needed once daily for pain control.  Do not to use additional NSAIDs while taking meloxicam.  May use Tylenol [phone] mg 2 to 3 times a day for breakthrough pain.  -Recommend limiting physical activity for the next 2 to 3 days for allow for rest and recovery and then may reintroduce physical activity next week  Pertinent previous records reviewed include none   Follow Up: As needed in 2 to 3 weeks if no improvement or worsening of symptoms.  Would obtain knee x-ray and could discuss CSI at that point   Subjective:   I, Daniel Cuevas, am serving as a Neurosurgeon for Doctor Daniel Cuevas  Chief Complaint: right knee pain   HPI:   07/27/21 Patient is a 66 year old male complaining of right knee pain. 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 [facility] a couple of weeks go his knee started getting tight on him , quad right above knee , really started bothering him Tuesday night states knee was really swollen but has gone down has been doing his RICES , no MOI, had a little trouble putting weight on it going down steps has gotten better wife hasn't let him go to HITT class, took some ib last night and that seemed to help some, no numbness tingling, no clicking locking or popping states has a hx of left meniscal tear so he would know the feeling   Relevant Historical Information: None  pertinent  Additional pertinent review of systems negative.   Current Outpatient Medications:    clotrimazole-betamethasone (LOTRISONE) cream, Apply 1 application topically 2 (two) times daily., Disp: 30 g, Rfl: 1   diphenhydrAMINE (BENADRYL) 25 MG tablet, Take 25 mg by mouth every 6 (six) hours as needed.  , Disp: , Rfl:    DYMISTA 137-50 MCG/ACT SUSP, SPRAY 1 SPRAY INTO EACH NOSTRIL TWICE A DAY, Disp: , Rfl: 3   levocetirizine (XYZAL) 5 MG tablet, Take 5 mg by mouth every evening., Disp: , Rfl:    meloxicam (MOBIC) 15 MG tablet, Take 1 tablet (15 mg total) by mouth daily., Disp: 30 tablet, Rfl: 0   montelukast (SINGULAIR) 10 MG tablet, Take 10 mg by mouth at bedtime., Disp: , Rfl:    Multiple Vitamin (MULTIVITAMIN) tablet, Take 1 tablet by mouth daily.  , Disp: , Rfl:    Olopatadine HCl 0.2 % SOLN, Apply 1 drop to eye daily as needed., Disp: , Rfl:    fluticasone (FLONASE) 50 MCG/ACT nasal spray, Place 2 sprays into the nose daily., Disp: 48 g, Rfl: 3   Objective:     Vitals:   07/27/21 1500  BP: 110/70  Pulse: (!) 54  SpO2: 98%  Weight: 195 lb (88.5 kg)  Height: 5\' 11"  (1.803 m)      Body mass index is 27.2 kg/m.    Physical Exam:    General:  awake, alert oriented, no acute distress nontoxic Skin: no  suspicious lesions or rashes Neuro:sensation intact, no deficits, strength 5/5 with no deficits, no atrophy, normal muscle tone Psych: No signs of anxiety, depression or other mood disorder  Right knee: No swelling No deformity Neg fluid wave, joint milking ROM Flex 110 , Ext 0  NTTP over the quad tendon, medial fem condyle, lat fem condyle, patella, plica, patella tendon, tibial tuberostiy, fibular head, posterior fossa, pes anserine bursa, gerdy's tubercle, medial jt line, lateral jt line Neg anterior and posterior drawer Neg lachman Neg sag sign Negative varus stress Negative valgus stress Negative McMurray Negative Thessaly Negative grind test  Gait normal     Electronically signed by:  Benito Mccreedy D.[facility] 3:26 PM 07/27/21

## 2021-07-27 NOTE — Patient Instructions (Addendum)
Good to see you  - Start meloxicam 15 mg daily x2 weeks.  If still having pain after 2 weeks, complete 3rd-week of meloxicam. May use remaining meloxicam as needed once daily for pain control.  Do not to use additional NSAIDs while taking meloxicam.  May use Tylenol 500-1000 mg 2 to 3 times a day for breakthrough pain. As needed follow up  

## 2021-08-08 ENCOUNTER — Ambulatory Visit: Payer: Self-pay

## 2021-08-08 ENCOUNTER — Ambulatory Visit: Payer: BC Managed Care – PPO | Admitting: Sports Medicine

## 2021-08-08 ENCOUNTER — Ambulatory Visit (INDEPENDENT_AMBULATORY_CARE_PROVIDER_SITE_OTHER): Payer: BC Managed Care – PPO

## 2021-08-08 VITALS — BP 122/72 | HR 64 | Ht 71.0 in | Wt 193.0 lb

## 2021-08-08 DIAGNOSIS — M25561 Pain in right knee: Secondary | ICD-10-CM

## 2021-08-08 NOTE — Patient Instructions (Addendum)
Good to see you Recommend calling in 2 weeks to let us know if symptoms are improving  if not would order MRI of right knee and you will follow up 3 days after MRI to discuss results

## 2021-08-08 NOTE — Progress Notes (Signed)
Daniel Cuevas D.[name] Phone: [phone]   Assessment and Plan:     1. Acute pain of right knee -Acute, worsening, subsequent visit - No improvement in right knee pain with increased intra-articular effusion at today's visit despite activity modification and 2-week course of meloxicam 15 mg daily - Patient elected for aspiration and injection of knee.  Tolerated well per note below - Unclear etiology of patient's current knee pain, with patellofemoral versus meniscal pathology most likely - X-ray obtained in clinic.  My interpretation: No acute fracture or dislocation.  Medial and lateral cortical changes to patella - DG Knee AP/LAT W/Sunrise Right; Future - [place] LIMITED JOINT SPACE STRUCTURES LOW RIGHT(NO LINKED CHARGES)   Procedure: Ultrasound Guided Knee Joint Injection/Aspiration Side: right Diagnosis: right knee pain and effusion [place] Indication:  - accuracy is paramount for diagnosis - to ensure therapeutic efficacy or procedural success - to reduce procedural risk  Risks explained and consent was given verbally. The site was cleaned with Chlorhexidine. The suprapatellar pouch of the knee was identified.  A superficial wheal and numbing track was made using 25-gauge needle and 5 mL 1% lidocaine.  An 18-gauge needle was introduced to the pouch under ultrasound guidance.  75 mL of  clear/straw colored synovial fluid was aspirated. Syringe was exchanged and injection given using 53mL of 1% lidocaine without epinephrine and 60mL of kenalog 40mg /ml. This was well tolerated and resulted in symptomatic relief.  Needle was removed, hemostasis achieved, and post injection instructions were explained.   Pt was advised to call or return to clinic if these symptoms worsen or fail to improve as anticipated.   Pertinent previous records reviewed include none   Follow Up: Patient to call our clinic in 2 to 3 weeks to give  [place] an update.  If no improvement or worsening of symptoms, would order MRI right knee and outpatient follow-up 3 days after MRI to review results and discuss treatment plan   Subjective:   I, Daniel Cuevas, am serving as a Education administrator for Doctor Daniel Cuevas   Chief Complaint: right knee pain    HPI:    07/27/21 Patient is a 66 year old male complaining of right knee pain. 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 [facility] a couple of weeks go his knee started getting tight on him , quad right above knee , really started bothering him Tuesday night states knee was really swollen but has gone down has been doing his RICES , no MOI, had a little trouble putting weight on it going down steps has gotten better wife hasn't let him go to HITT class, took some ib last night and that seemed to help some, no numbness tingling, no clicking locking or popping states has a hx of left meniscal tear so he would know the feeling    08/08/2021 Patient states that the knee has progressed in swelling , experiencing more pain and meloxicam isn't helping much, hasnt gone to his workout class and still feels that pressure when he puts his shoes on has pain and tight in the quad just above the knee cap and in the back of the knee    Relevant Historical Information: None pertinent  Additional pertinent review of systems negative.   Current Outpatient Medications:    clotrimazole-betamethasone (LOTRISONE) cream, Apply 1 application topically 2 (two) times daily., Disp: 30 g, Rfl: 1   diphenhydrAMINE (BENADRYL) 25 MG tablet, Take  25 mg by mouth every 6 (six) hours as needed.  , Disp: , Rfl:    DYMISTA 137-50 MCG/ACT SUSP, SPRAY 1 SPRAY INTO EACH NOSTRIL TWICE A DAY, Disp: , Rfl: 3   levocetirizine (XYZAL) 5 MG tablet, Take 5 mg by mouth every evening., Disp: , Rfl:    meloxicam (MOBIC) 15 MG tablet, Take 1 tablet (15 mg total) by mouth daily., Disp: 30 tablet, Rfl: 0   montelukast (SINGULAIR) 10 MG tablet, Take  10 mg by mouth at bedtime., Disp: , Rfl:    Multiple Vitamin (MULTIVITAMIN) tablet, Take 1 tablet by mouth daily.  , Disp: , Rfl:    Olopatadine HCl 0.2 % SOLN, Apply 1 drop to eye daily as needed., Disp: , Rfl:    fluticasone (FLONASE) 50 MCG/ACT nasal spray, Place 2 sprays into the nose daily., Disp: 48 g, Rfl: 3   Objective:     Vitals:   08/08/21 1451  BP: 122/72  Pulse: 64  SpO2: 98%  Weight: 193 lb (87.5 kg)  Height: 5\' 11"  (1.803 m)      Body mass index is 26.92 kg/m.    Physical Exam:    General:  awake, alert oriented, no acute distress nontoxic Skin: no suspicious lesions or rashes Neuro:sensation intact, no deficits, strength 5/5 with no deficits, no atrophy, normal muscle tone Psych: No signs of anxiety, depression or other mood disorder  Knee: Large global swelling No deformity Positive fluid wave, joint milking ROM Flex 80, Ext 10 TTP lateral femoral condyle, lateral joint line NTTP over the quad tendon, medial fem condyle, patella, plica, patella tendon, tibial tuberostiy, fibular head, posterior fossa, pes anserine bursa, gerdy's tubercle, medial jt line,  Neg anterior and posterior drawer Neg lachman Neg sag sign Negative varus stress Negative valgus stress Negative McMurray    Gait abnormal favoring left leg   Electronically signed by:  Daniel Cuevas D.[facility] 4:37 PM 08/08/21

## 2021-12-04 NOTE — Progress Notes (Unsigned)
    Daniel Cuevas Daniel Cuevas Phone: [phone]   Assessment and Plan:     There are no diagnoses linked to this encounter.  ***   Pertinent previous records reviewed include ***   Follow Up: ***     Subjective:   I, Daniel Cuevas, am serving as a Education administrator for Doctor Daniel Cuevas  Chief Complaint: right ankle swelling  HPI:   12/05/2021 Patient is a 66 year old male complaining of right ankle swelling. Patient states  Relevant Historical Information: ***  Additional pertinent review of systems negative.   Current Outpatient Medications:    clotrimazole-betamethasone (LOTRISONE) cream, Apply 1 application topically 2 (two) times daily., Disp: 30 g, Rfl: 1   diphenhydrAMINE (BENADRYL) 25 MG tablet, Take 25 mg by mouth every 6 (six) hours as needed.  , Disp: , Rfl:    DYMISTA 137-50 MCG/ACT SUSP, SPRAY 1 SPRAY INTO EACH NOSTRIL TWICE A DAY, Disp: , Rfl: 3   fluticasone (FLONASE) 50 MCG/ACT nasal spray, Place 2 sprays into the nose daily., Disp: 48 g, Rfl: 3   levocetirizine (XYZAL) 5 MG tablet, Take 5 mg by mouth every evening., Disp: , Rfl:    meloxicam (MOBIC) 15 MG tablet, Take 1 tablet (15 mg total) by mouth daily., Disp: 30 tablet, Rfl: 0   montelukast (SINGULAIR) 10 MG tablet, Take 10 mg by mouth at bedtime., Disp: , Rfl:    Multiple Vitamin (MULTIVITAMIN) tablet, Take 1 tablet by mouth daily.  , Disp: , Rfl:    Olopatadine HCl 0.2 % SOLN, Apply 1 drop to eye daily as needed., Disp: , Rfl:    Objective:     There were no vitals filed for this visit.    There is no height or weight on file to calculate BMI.    Physical Exam:    ***   Electronically signed by:  Daniel Cuevas D.[facility] 7:48 AM 12/04/21

## 2021-12-05 ENCOUNTER — Ambulatory Visit: Payer: BC Managed Care – PPO | Admitting: Sports Medicine

## 2021-12-05 ENCOUNTER — Ambulatory Visit (INDEPENDENT_AMBULATORY_CARE_PROVIDER_SITE_OTHER): Payer: BC Managed Care – PPO

## 2021-12-05 VITALS — BP 110/80 | HR 73 | Ht 71.0 in | Wt 186.0 lb

## 2021-12-05 DIAGNOSIS — M25571 Pain in right ankle and joints of right foot: Secondary | ICD-10-CM

## 2021-12-05 DIAGNOSIS — M76821 Posterior tibial tendinitis, right leg: Secondary | ICD-10-CM

## 2021-12-05 MED ORDER — MELOXICAM 15 MG PO TABS: 15.0000 mg | ORAL_TABLET | Freq: Every day | ORAL | 0 refills | Status: DC

## 2021-12-05 NOTE — Patient Instructions (Addendum)
Good to see you  - Start meloxicam 15 mg daily x2 weeks.  If still having pain after 2 weeks, complete 3rd-week of meloxicam. May use remaining meloxicam as needed once daily for pain control.  Do not to use additional NSAIDs while taking meloxicam.  May use Tylenol 500-1000 mg 2 to 3 times a day for breakthrough pain. Ankle HEP 3 week follow up  

## 2021-12-25 NOTE — Progress Notes (Unsigned)
    Benito Mccreedy D.[place] Phone: [phone]   Assessment and Plan:     There are no diagnoses linked to this encounter.  ***   Pertinent previous records reviewed include ***   Follow Up: ***     Subjective:   I, Jilda Kress, am serving as a Education administrator for Doctor Glennon Mac   Chief Complaint: right ankle swelling   HPI:    12/05/2021 Patient is a 66 year old male complaining of right ankle swelling. Patient states that his ankle is swollen been going on for 3-4 weeks doesn't remember a MOI is in the process of moving could have bumped it on something, no tingling, has pain that goes up the malleolus , no tylenol or meds for the pain , intermittent pain,   12/26/2021 Patient states    Relevant Historical Information: None pertinent  Additional pertinent review of systems negative.   Current Outpatient Medications:    clotrimazole-betamethasone (LOTRISONE) cream, Apply 1 application topically 2 (two) times daily., Disp: 30 g, Rfl: 1   diphenhydrAMINE (BENADRYL) 25 MG tablet, Take 25 mg by mouth every 6 (six) hours as needed.  , Disp: , Rfl:    DYMISTA 137-50 MCG/ACT SUSP, SPRAY 1 SPRAY INTO EACH NOSTRIL TWICE A DAY, Disp: , Rfl: 3   fluticasone (FLONASE) 50 MCG/ACT nasal spray, Place 2 sprays into the nose daily., Disp: 48 g, Rfl: 3   levocetirizine (XYZAL) 5 MG tablet, Take 5 mg by mouth every evening., Disp: , Rfl:    meloxicam (MOBIC) 15 MG tablet, Take 1 tablet (15 mg total) by mouth daily., Disp: 30 tablet, Rfl: 0   meloxicam (MOBIC) 15 MG tablet, Take 1 tablet (15 mg total) by mouth daily., Disp: 30 tablet, Rfl: 0   montelukast (SINGULAIR) 10 MG tablet, Take 10 mg by mouth at bedtime., Disp: , Rfl:    Multiple Vitamin (MULTIVITAMIN) tablet, Take 1 tablet by mouth daily.  , Disp: , Rfl:    Olopatadine HCl 0.2 % SOLN, Apply 1 drop to eye daily as needed., Disp: , Rfl:    Objective:      There were no vitals filed for this visit.    There is no height or weight on file to calculate BMI.    Physical Exam:    ***   Electronically signed by:  Benito Mccreedy D.[facility] 10:44 AM 12/25/21

## 2021-12-26 ENCOUNTER — Ambulatory Visit: Payer: BC Managed Care – PPO | Admitting: Sports Medicine

## 2021-12-26 ENCOUNTER — Ambulatory Visit: Payer: Self-pay

## 2021-12-26 VITALS — BP 122/72 | HR 77 | Ht 71.0 in | Wt 189.0 lb

## 2021-12-26 DIAGNOSIS — M25571 Pain in right ankle and joints of right foot: Secondary | ICD-10-CM | POA: Diagnosis not present

## 2021-12-26 DIAGNOSIS — M76821 Posterior tibial tendinitis, right leg: Secondary | ICD-10-CM

## 2021-12-26 NOTE — Patient Instructions (Addendum)
Good to see you 3 week follow up  

## 2022-01-16 ENCOUNTER — Ambulatory Visit: Payer: BC Managed Care – PPO | Admitting: Sports Medicine

## 2022-02-20 NOTE — Progress Notes (Signed)
Aleen Sells D.[facility] [address] [place] [address] Phone: [phone]   Assessment and Plan:     1. Chronic pain of right ankle 2. Posterior tibial tendinitis of right lower extremity  -Chronic with exacerbation, subsequent visit - Patient had significant, though incomplete relief after CSI to posterior tibialis tendon on 12/26/2021 which was helpful for about 3 to 4 weeks, however since that time patient has had complete recurrence of symptoms and thinks it may be mildly worsening - Patient's symptoms are still most consistent with a posterior tibial tendinitis based on HPI and physical exam, however with failure to improve with >6 weeks of conservative therapy, CSI, NSAID course, HEP, with relatively unremarkable x-ray imaging, I feel it is necessary to further evaluate with right ankle MRI at this time - Recommend boot use consistently for the next 2 to 3 weeks until MRI can be performed - Start HEP to prevent stiffness of ankle  Pertinent previous records reviewed include none   Follow Up: 3 days after MRI to review results and create treatment plan   Subjective:   I, [name], am serving as a Neurosurgeon for Doctor Richardean Sale   Chief Complaint: right ankle swelling   HPI:    12/05/2021 Patient is a 66 year old male complaining of right ankle swelling. Patient states that his ankle is swollen been going on for 3-4 weeks doesn't remember a MOI is in the process of moving could have bumped it on something, no tingling, has pain that goes up the malleolus , no tylenol or meds for the pain , intermittent pain,    12/26/2021 Patient states that he is still swollen , meloxicam helped with the pain   02/21/2022 Patient states that he is still not feeling better, he has a constant limp, can't get a full stride, CSI did help, 3-4 weeks after he noticed the swelling was back , hasn't been able to get back to full activity , right  knee swells occasionally in the am and then the swelling will go away       Relevant Historical Information: None pertinent  Additional pertinent review of systems negative.   Current Outpatient Medications:    clotrimazole-betamethasone (LOTRISONE) cream, Apply 1 application topically 2 (two) times daily., Disp: 30 g, Rfl: 1   diphenhydrAMINE (BENADRYL) 25 MG tablet, Take 25 mg by mouth every 6 (six) hours as needed.  , Disp: , Rfl:    DYMISTA 137-50 MCG/ACT SUSP, SPRAY 1 SPRAY INTO EACH NOSTRIL TWICE A DAY, Disp: , Rfl: 3   levocetirizine (XYZAL) 5 MG tablet, Take 5 mg by mouth every evening., Disp: , Rfl:    meloxicam (MOBIC) 15 MG tablet, Take 1 tablet (15 mg total) by mouth daily., Disp: 30 tablet, Rfl: 0   meloxicam (MOBIC) 15 MG tablet, Take 1 tablet (15 mg total) by mouth daily., Disp: 30 tablet, Rfl: 0   montelukast (SINGULAIR) 10 MG tablet, Take 10 mg by mouth at bedtime., Disp: , Rfl:    Multiple Vitamin (MULTIVITAMIN) tablet, Take 1 tablet by mouth daily.  , Disp: , Rfl:    Olopatadine HCl 0.2 % SOLN, Apply 1 drop to eye daily as needed., Disp: , Rfl:    fluticasone (FLONASE) 50 MCG/ACT nasal spray, Place 2 sprays into the nose daily., Disp: 48 g, Rfl: 3   Objective:     Vitals:   02/21/22 1505  BP: 122/80  Pulse: (!) 57  SpO2: 97%  Weight: 192 lb (87.1 kg)  Height: 5\' 11"  (1.803 m)      Body mass index is 26.78 kg/m.    Physical Exam:    Gen: Appears well, nad, nontoxic and pleasant Psych: Alert and oriented, appropriate mood and affect Neuro: sensation intact, strength is 5/5 with df/pf/inv/ev, muscle tone wnl Skin: no susupicious lesions or rashes   Right ankle: no deformity, no swelling or effusion TTP medial malleolus, posterior to medial malleolus along posterior tibialis tendon NTTP over fibular head, lat mal, medial mal, achilles, navicular, base of 5th, ATFL, CFL, deltoid, calcaneous or midfoot ROM DF 30, PF 45, inv/ev intact Negative ant drawer,  talar tilt, rotation test, squeeze test. Neg thompson No pain with resisted inversion or eversion  Pain with resisted plantarflexion    Electronically signed by:  Aleen Sells D.[facility] 3:37 PM 02/21/22

## 2022-02-21 ENCOUNTER — Ambulatory Visit: Payer: BC Managed Care – PPO | Admitting: Sports Medicine

## 2022-02-21 VITALS — BP 122/80 | HR 57 | Ht 71.0 in | Wt 192.0 lb

## 2022-02-21 DIAGNOSIS — M25571 Pain in right ankle and joints of right foot: Secondary | ICD-10-CM

## 2022-02-21 DIAGNOSIS — G8929 Other chronic pain: Secondary | ICD-10-CM | POA: Diagnosis not present

## 2022-02-21 DIAGNOSIS — M76821 Posterior tibial tendinitis, right leg: Secondary | ICD-10-CM

## 2022-02-21 NOTE — Patient Instructions (Addendum)
Good to see you  MRI right ankle  Follow up 3 days after MRI to discuss results

## 2022-03-20 ENCOUNTER — Ambulatory Visit
Admission: RE | Admit: 2022-03-20 | Discharge: 2022-03-20 | Disposition: A | Discharge status: Home / Self Care | Payer: BC Managed Care – PPO | Source: Ambulatory Visit | Attending: Sports Medicine | Admitting: Sports Medicine

## 2022-03-20 DIAGNOSIS — G8929 Other chronic pain: Secondary | ICD-10-CM

## 2022-03-21 ENCOUNTER — Ambulatory Visit: Payer: BC Managed Care – PPO | Admitting: Sports Medicine

## 2022-03-21 VITALS — BP 118/80 | HR 57 | Ht 71.0 in | Wt 192.0 lb

## 2022-03-21 DIAGNOSIS — M958 Other specified acquired deformities of musculoskeletal system: Secondary | ICD-10-CM | POA: Diagnosis not present

## 2022-03-21 DIAGNOSIS — G8929 Other chronic pain: Secondary | ICD-10-CM | POA: Diagnosis not present

## 2022-03-21 DIAGNOSIS — M25571 Pain in right ankle and joints of right foot: Secondary | ICD-10-CM | POA: Diagnosis not present

## 2022-03-21 DIAGNOSIS — M76821 Posterior tibial tendinitis, right leg: Secondary | ICD-10-CM

## 2022-03-21 MED ORDER — METHYLPREDNISOLONE 4 MG PO TBPK: ORAL_TABLET | ORAL | 0 refills | Status: DC

## 2022-03-21 NOTE — Progress Notes (Signed)
Daniel Cuevas D.[place] Phone: [phone]   Assessment and Plan:     1. Chronic pain of right ankle 2. 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  Patient currently has no pain with weightbearing in boot.  Recommend continued boot use for an additional 3 to 4 weeks - Start prednisone Dosepak    Pertinent previous records reviewed include ankle MRI 03/20/2022   Follow Up: 3 weeks for reevaluation.  Could consider discontinuing boot use if improvements in ankle pain.  Could then start physical therapy and further evaluate right knee pain for potential intra-articular CSI   Subjective:   I, Moenique Parris, am serving as a Education administrator for Doctor Glennon Mac   Chief Complaint: right ankle swelling   HPI:    12/05/2021 Patient is a 67 year old male complaining of right ankle swelling. Patient states that his ankle is swollen been going on for 3-4 weeks doesn't remember a MOI is in the process of moving could have bumped it on something, no tingling, has pain that goes up the malleolus , no tylenol or meds for the pain , intermittent pain,    12/26/2021 Patient states that he is still swollen , meloxicam helped with the pain    02/21/2022 Patient states that he is still not feeling better, he has a constant limp, can't get a full stride, CSI did help, 3-4 weeks after he noticed the swelling was back , hasn't been able to get back to full activity , right knee swells occasionally in the am and  then the swelling will go away    03/21/2022 Patient states that he is the same , R knee is still swollen      Relevant Historical Information: None pertinent  Additional pertinent review of systems negative.   Current Outpatient Medications:    clotrimazole-betamethasone (LOTRISONE) cream, Apply 1 application topically 2 (two) times daily., Disp: 30 g, Rfl: 1   diphenhydrAMINE (BENADRYL) 25 MG tablet, Take 25 mg by mouth every 6 (six) hours as needed.  , Disp: , Rfl:    DYMISTA 137-50 MCG/ACT SUSP, SPRAY 1 SPRAY INTO EACH NOSTRIL TWICE A DAY, Disp: , Rfl: 3   levocetirizine (XYZAL) 5 MG tablet, Take 5 mg by mouth every evening., Disp: , Rfl:    meloxicam (MOBIC) 15 MG tablet, Take 1 tablet (15 mg total) by mouth daily., Disp: 30 tablet, Rfl: 0   meloxicam (MOBIC) 15 MG tablet, Take 1 tablet (15 mg total) by mouth daily., Disp: 30 tablet, Rfl: 0   methylPREDNISolone (MEDROL DOSEPAK) 4 MG TBPK tablet, Take 6 tablets on day 1.  Take 5 tablets on day 2.  Take 4 tablets on day 3.  Take 3 tablets on day 4.  Take 2 tablets on day 5.  Take 1 tablet on day 6., Disp: 21 tablet, Rfl: 0   montelukast (SINGULAIR) 10 MG tablet, Take 10 mg by mouth at bedtime., Disp: , Rfl:    Multiple Vitamin (MULTIVITAMIN) tablet, Take 1 tablet by mouth daily.  , Disp: , Rfl:    Olopatadine HCl 0.2 %  SOLN, Apply 1 drop to eye daily as needed., Disp: , Rfl:    fluticasone (FLONASE) 50 MCG/ACT nasal spray, Place 2 sprays into the nose daily., Disp: 48 g, Rfl: 3   Objective:     Vitals:   03/21/22 1516  BP: 118/80  Pulse: (!) 57  SpO2: 97%  Weight: 192 lb (87.1 kg)  Height: 5\' 11"  (1.803 m)      Body mass index is 26.78 kg/m.    Physical Exam:    Gen: Appears well, nad, nontoxic and pleasant Psych: Alert and oriented, appropriate mood and affect Neuro: sensation intact, strength is 5/5 with df/pf/inv/ev, muscle tone wnl Skin: no susupicious lesions or rashes   Right ankle: no deformity, no swelling or  effusion TTP medial malleolus, posterior to medial malleolus along posterior tibialis tendon NTTP over fibular head, lat mal, medial mal, achilles, navicular, base of 5th, ATFL, CFL, deltoid, calcaneous or midfoot ROM DF 30, PF 45, inv/ev intact Negative ant drawer, talar tilt, rotation test, squeeze test. Neg thompson No pain with resisted inversion or eversion  Pain with resisted plantarflexion     Electronically signed by:  Daniel Cuevas D.[facility] 4:00 PM 03/21/22

## 2022-03-21 NOTE — Patient Instructions (Addendum)
Good to see you  Continue boot use for the next 3 weeks  Prednisone dos pak 3 week follow up

## 2022-04-09 NOTE — Progress Notes (Unsigned)
Daniel Cuevas D.[facility] [place] Phone: [phone]   Assessment and Plan:     There are no diagnoses linked to this encounter.  ***   Pertinent previous records reviewed include ***   Follow Up: ***     Subjective:   I, Daniel Cuevas, am serving as a Education administrator for Doctor Daniel Cuevas   Chief Complaint: right ankle swelling   HPI:    12/05/2021 Patient is a 67 year old male complaining of right ankle swelling. Patient states that his ankle is swollen been going on for 3-4 weeks doesn't remember a MOI is in the process of moving could have bumped it on something, no tingling, has pain that goes up the malleolus , no tylenol or meds for the pain , intermittent pain,    12/26/2021 Patient states that he is still swollen , meloxicam helped with the pain    02/21/2022 Patient states that he is still not feeling better, he has a constant limp, can't get a full stride, CSI did help, 3-4 weeks after he noticed the swelling was back , hasn't been able to get back to full activity , right knee swells occasionally in the am and then the swelling will go away    03/21/2022 Patient states that he is the same , R knee is still swollen    04/10/2022 Patient states  Relevant Historical Information: None pertinent  Additional pertinent review of systems negative.   Current Outpatient Medications:    clotrimazole-betamethasone (LOTRISONE) cream, Apply 1 application topically 2 (two) times daily., Disp: 30 g, Rfl: 1   diphenhydrAMINE (BENADRYL) 25 MG tablet, Take 25 mg by mouth every 6 (six) hours as needed.  , Disp: , Rfl:    DYMISTA 137-50 MCG/ACT SUSP, SPRAY 1 SPRAY INTO EACH NOSTRIL TWICE A DAY, Disp: , Rfl: 3   fluticasone (FLONASE) 50 MCG/ACT nasal spray, Place 2 sprays into the nose daily., Disp: 48 g, Rfl: 3   levocetirizine (XYZAL) 5 MG tablet, Take 5 mg by mouth every evening., Disp: , Rfl:    meloxicam  (MOBIC) 15 MG tablet, Take 1 tablet (15 mg total) by mouth daily., Disp: 30 tablet, Rfl: 0   meloxicam (MOBIC) 15 MG tablet, Take 1 tablet (15 mg total) by mouth daily., Disp: 30 tablet, Rfl: 0   methylPREDNISolone (MEDROL DOSEPAK) 4 MG TBPK tablet, Take 6 tablets on day 1.  Take 5 tablets on day 2.  Take 4 tablets on day 3.  Take 3 tablets on day 4.  Take 2 tablets on day 5.  Take 1 tablet on day 6., Disp: 21 tablet, Rfl: 0   montelukast (SINGULAIR) 10 MG tablet, Take 10 mg by mouth at bedtime., Disp: , Rfl:    Multiple Vitamin (MULTIVITAMIN) tablet, Take 1 tablet by mouth daily.  , Disp: , Rfl:    Olopatadine HCl 0.2 % SOLN, Apply 1 drop to eye daily as needed., Disp: , Rfl:    Objective:     There were no vitals filed for this visit.    There is no height or weight on file to calculate BMI.    Physical Exam:    ***   Electronically signed by:  Daniel Cuevas D.[facility] 11:58 AM 04/09/22

## 2022-04-10 ENCOUNTER — Ambulatory Visit: Payer: BC Managed Care – PPO | Admitting: Sports Medicine

## 2022-04-10 ENCOUNTER — Ambulatory Visit: Payer: Self-pay

## 2022-04-10 VITALS — HR 67 | Ht 71.0 in | Wt 188.0 lb

## 2022-04-10 DIAGNOSIS — M25571 Pain in right ankle and joints of right foot: Secondary | ICD-10-CM

## 2022-04-10 DIAGNOSIS — G8929 Other chronic pain: Secondary | ICD-10-CM

## 2022-04-10 DIAGNOSIS — M25561 Pain in right knee: Secondary | ICD-10-CM | POA: Diagnosis not present

## 2022-04-10 DIAGNOSIS — M958 Other specified acquired deformities of musculoskeletal system: Secondary | ICD-10-CM

## 2022-04-10 NOTE — Addendum Note (Signed)
Addended by: Pincus Badder R on: 04/10/2022 03:43 PM   Modules accepted: Orders

## 2022-04-10 NOTE — Patient Instructions (Addendum)
Good to see you  Pt referral  3 week follow up

## 2022-04-10 NOTE — Addendum Note (Signed)
Addended by: Carrolyn Leigh on: 04/10/2022 03:37 PM   Modules accepted: Orders

## 2022-04-16 LAB — ANAEROBIC AND AEROBIC CULTURE
AER RESULT:: NO GROWTH
MICRO NUMBER:: 14492925
MICRO NUMBER:: 14492926
SPECIMEN QUALITY:: ADEQUATE
SPECIMEN QUALITY:: ADEQUATE

## 2022-04-16 LAB — SYNOVIAL FLUID ANALYSIS, COMPLETE
Basophils, %: 0 %
Eosinophils-Synovial: 0 % (ref 0–2)
Lymphocytes-Synovial Fld: 5 % (ref 0–74)
Monocyte/Macrophage: 7 % (ref 0–69)
Neutrophil, Synovial: 88 % — ABNORMAL HIGH (ref 0–24)
Synoviocytes, %: 0 % (ref 0–15)
WBC, Synovial: 24420 cells/uL — ABNORMAL HIGH (ref <150)

## 2022-04-24 ENCOUNTER — Ambulatory Visit: Payer: BC Managed Care – PPO

## 2022-04-26 ENCOUNTER — Encounter: Payer: Self-pay | Admitting: Physical Therapy

## 2022-04-26 ENCOUNTER — Other Ambulatory Visit: Payer: Self-pay

## 2022-04-26 ENCOUNTER — Ambulatory Visit: Payer: BC Managed Care – PPO | Attending: Sports Medicine | Admitting: Physical Therapy

## 2022-04-26 DIAGNOSIS — M25571 Pain in right ankle and joints of right foot: Secondary | ICD-10-CM | POA: Insufficient documentation

## 2022-04-26 DIAGNOSIS — G8929 Other chronic pain: Secondary | ICD-10-CM | POA: Insufficient documentation

## 2022-04-26 DIAGNOSIS — M25561 Pain in right knee: Secondary | ICD-10-CM | POA: Insufficient documentation

## 2022-04-26 DIAGNOSIS — R2681 Unsteadiness on feet: Secondary | ICD-10-CM | POA: Insufficient documentation

## 2022-04-26 DIAGNOSIS — M6281 Muscle weakness (generalized): Secondary | ICD-10-CM | POA: Insufficient documentation

## 2022-04-26 NOTE — Therapy (Signed)
OUTPATIENT PHYSICAL THERAPY LOWER EXTREMITY EVALUATION  Patient Name: Daniel Cuevas MRN: [id number] DOB:27-May-1955, 67 y.o., male Today's Date: 04/26/2022   PT End of Session - 04/26/22 0828     Visit Number 1    Number of Visits --   1-2x/week   Date for PT Re-Evaluation 06/21/22    Authorization Type [employer] - FOTO    PT Start Time 0745    PT Stop Time 0824    PT Time Calculation (min) 39 min             Past Medical History:  Diagnosis Date   Allergy    History of chicken pox    MRSA infection    in past   Past Surgical History:  Procedure Laterality Date   KNEE SURGERY  1995   left, meniscus   Patient Active Problem List   Diagnosis Date Noted   Thumb laceration 07/03/2016   Skin lesion 06/15/2015   Olecranon bursitis 05/16/2013   Encounter for screening colonoscopy 09/10/2012   Allergic rhinitis 05/16/2012   Fungal infection of skin 05/16/2012   Abnormal TSH 09/25/2011   Routine general medical examination at a health care facility 01/11/2011    PCP: Tonia Ghent, MD  REFERRING PROVIDER: Glennon Mac, DO  THERAPY DIAG:  Chronic pain of right knee - Plan: PT plan of care cert/re-cert  Pain in right ankle and joints of right foot - Plan: PT plan of care cert/re-cert  Muscle weakness - Plan: PT plan of care cert/re-cert  Unsteadiness on feet - Plan: PT plan of care cert/re-cert  REFERRING DIAG: Chronic pain of right knee [M25.561, G89.29], 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 No, Number of falls: 0  MOI/History of condition:  Onset date: 3 months ago  Daniel Cuevas is a 67 y.o. male who presents to clinic with chief complaint of R ankle pain and R knee pain.   Ankle pain started after he bumped his medial malleolus.  He was put in a boot for 6 weeks.  During this time his knee became swollen.  His knee did swell previously after completing several bouts of intense exercise.  He has had his knee aspirated twice.  His ankle is now doing relatively well, but he feels it is weak.  His knee is ok "in a straight line" but is painful when he plants and "torques" on the knee.  Pt with elevated WBC and neutrophil count on analysis of knee fluid.  (-) culture for infection.  Will follow up with referring MD in 5 days.  From referring provider:   "1. Chronic pain of right knee -Chronic with exacerbation, subsequent visit - Suspect that patient's right knee pain has flared due to prolonged use of boot - We will discontinue boot use at this time - Patient elected for aspiration and CSI.  Tolerated well per note below.  Fluid sent off for analysis. - Discontinue prednisone Dosepak -Start physical therapy   2. Osteochondral defect of talus 3. Chronic pain of right ankle  -Chronic with exacerbation, subsequent visit - Overall significant improvement in ankle pain with prolonged boot use and prednisone Dosepak - Discontinue Dosepak at this time - Continue HEP -Start physical  therapy Pertinent previous records reviewed include none"   Red flags:  denies   Pain:  Are you having pain? Yes Pain location: R ankle and R knee NPRS scale:  0/10 to 3/10 Aggravating factors: planting foot and changing direction Relieving factors: rest Pain description: sharp and aching Stage: Chronic Stability: getting better 24 hour pattern: worse with activity   Occupation: desk job  Assistive Device: NA  Hand Dominance: NA  Patient Goals/Specific Activities: work on strength, ROM, return to HIT class   OBJECTIVE:   DIAGNOSTIC FINDINGS:  Recent MRI ankle:  IMPRESSION: 1. Thickening of the tibialis posterior with moderate surrounding edema suggesting tendinosis  with tenosynovitis. 2. Mild edema of the tibialis posterior muscle, likely sequela of chronic strain/myotendinous injury. 3. Osteochondral injury about the medial aspect of the talar dome with subchondral cystic changes and edema. 4. Small subtalar and talonavicular joint effusions. 5. Ligaments of the ankle are intact. 6. No evidence of fracture or osteonecrosis.   GENERAL OBSERVATION/GAIT:   R hip drop in gait  SENSATION:  Light touch: Appears intact  PALPATION: Minimal TTP  MUSCLE LENGTH: Hamstrings: Right subtle restriction; Left no restriction ASLR: Right ASLR = PSLR; Left ASLR = PSLR Thomas test: Right subtle restriction; Left subtle restriction Ely's test: Right subtle restriction; Left no restriction  LE MMT:  MMT Right 04/26/2022 Left 04/26/2022  Hip flexion (L2, L3) 5 5  Knee extension (L3) 4 5  Knee flexion 4+ 5  Hip abduction 4 5  Hip extension See SL bridge See SL bridge  Hip external rotation    Hip internal rotation    Hip adduction    Ankle dorsiflexion (L4) Heel walk c Heel walk c  Ankle plantarflexion (S1) Toe walk difficult R Toe walk C  Ankle inversion    Ankle eversion    Great Toe ext (L5)    Grossly     (Blank rows = not tested, score listed is out of 5 possible points.  N = WNL, D = diminished, C = clear for gross weakness with myotome testing, * = concordant pain with testing)  LE ROM:  ROM Right 04/26/2022 Left 04/26/2022  Hip flexion    Hip extension    Hip abduction    Hip adduction    Hip internal rotation    Hip external rotation    Knee flexion 120 130  Knee extension n n  Ankle dorsiflexion WNL WNL  Ankle plantarflexion WNL WNL  Ankle inversion WNL WNL  Ankle eversion     (Blank rows = not tested, N = WNL, * = concordant pain with testing)  Functional Tests  Eval (04/26/2022)    Supine single leg bridge: L 30'', R 15''    Progressive balance screen (highest level completed for >/= 10''):  Feet together: 10'' Semi  Tandem: R in rear 10'', L in rear 10'' Tandem: R in rear 10'', L in rear 10'' SLS: R 5'', L 10''                                                       LOWER EXTREMITY SPECIAL TESTS:  Knee special tests: McMurray's test: negative and Thessaly test: positive    PATIENT SURVEYS:  FOTO: 58 - >75   TODAY'S TREATMENT: Creating, reviewing, and completing below HEP   PATIENT EDUCATION:  POC,  diagnosis, prognosis, HEP, and outcome measures.  Pt educated via explanation, demonstration, and handout (HEP).  Pt confirms understanding verbally.   HOME EXERCISE PROGRAM: Access Code: [email or web address] URL: [email or web address] Date: 04/26/2022 Prepared by: Shearon Balo  Exercises - Active Straight Leg Raise with Quad Set  - 1 x daily - 7 x weekly - 3 sets - 10 reps - Sidelying Hip Abduction  - 1 x daily - 7 x weekly - 3 sets - 10 reps - Single Leg Bridge  - 1 x daily - 7 x weekly - 2 sets - 10 reps - Seated Toe Towel Scrunches  - 1 x daily - 7 x weekly - 3 sets - 10 reps - Single Leg Stance  - 2 x daily - 6 x weekly - 1 sets - 3 reps - 30 hold  ASTERISK SIGNS   Asterisk Signs Eval (04/26/2022)       HS, RF, hip flexor Mild restriction L>R       Direction changes Knee pain       SL bridge R 15'' with pelvic drop       MMT See chart       R SLS 5''         ASSESSMENT:  CLINICAL IMPRESSION: Cato is a 67 y.o. male who presents to clinic with signs and sxs consistent with R ankle weakness following prolonged immobilization in boot with concurrent R knee pain and swelling which is an acute exacerbation of a chronic knee pain.  Ankle seems to be doing well overall.  Unable to rule out meniscus pathology d/t R knee swelling and agg with closed chain rotational forces and MOI.    OBJECTIVE IMPAIRMENTS: Pain, knee/hip/ankle/core strength, balance, gait  ACTIVITY LIMITATIONS: HIT exercise class, deep squatting  PERSONAL FACTORS: See medical history and pertinent  history   REHAB POTENTIAL: Good  CLINICAL DECISION MAKING: Stable/uncomplicated  EVALUATION COMPLEXITY: Low   GOALS:   SHORT TERM GOALS: Target date: 05/24/2022  Alonzo will be >75% HEP compliant to improve carryover between sessions and facilitate independent management of condition  Evaluation (04/26/2022): ongoing Goal status: INITIAL   LONG TERM GOALS: Target date: 06/21/2022  Abu will improve FOTO score to 75 as a proxy for functional improvement  Evaluation/Baseline (04/26/2022): 58 Goal status: INITIAL   2.  Jeramiah will improve the following MMTs to >/= 5/5 to show improvement in strength:     Evaluation/Baseline (04/26/2022):   LE MMT:  MMT Right 04/26/2022 Left 04/26/2022  Hip flexion (L2, L3) 5 5  Knee extension (L3) 4 5  Knee flexion 4+ 5  Hip abduction 4 5  Hip extension See SL bridge See SL bridge  Hip external rotation    Hip internal rotation    Hip adduction    Ankle dorsiflexion (L4) Heel walk c Heel walk c  Ankle plantarflexion (S1) Toe walk difficult R Toe walk C  Ankle inversion    Ankle eversion    Great Toe ext (L5)    Grossly     (Blank rows = not tested, score listed is out of 5 possible points.  N = WNL, D = diminished, C = clear for gross weakness with myotome testing, * = concordant pain with testing)  Goal status: INITIAL   3.  Eusevio will be able to maintain supine single leg bridge for 40'' as evidence of improved hip extension and core strength (norm for healthy adult 30''- 60'')   Evaluation/Baseline (04/26/2022): L 30'' R 15'' Goal status: INITIAL  5.  Edden will report confidence in self management of condition at time of discharge with advanced HEP  Evaluation/Baseline (04/26/2022): unable to self manage Goal status: INITIAL   5.  Arno will be able to stand for >30'' in R SL stance on foam, to show a significant improvement in balance in order to reduce fall risk   Evaluation/Baseline (04/26/2022): 5'' R SLS Goal status:  INITIAL     PLAN: PT FREQUENCY: 1-2x/week  PT DURATION: 8 weeks (Ending 06/21/2022)  PLANNED INTERVENTIONS: Therapeutic exercises, Aquatic therapy, Therapeutic activity, Neuro Muscular re-education, Gait training, Patient/Family education, Joint mobilization, Dry Needling, Electrical stimulation, Spinal mobilization and/or manipulation, Moist heat, Taping, Vasopneumatic device, Ionotophoresis 32m/ml Dexamethasone, and Manual therapy  PLAN FOR NEXT SESSION: balance, knee/hip/LE strengthening, ankle strengthening, gait   KShearon BaloPT, DPT 04/26/2022, 9:24 AM

## 2022-04-30 NOTE — Progress Notes (Unsigned)
    Daniel Cuevas D.[facility] Phone: [phone]   Assessment and Plan:     There are no diagnoses linked to this encounter.  ***   Pertinent previous records reviewed include ***   Follow Up: ***     Subjective:   I, Daniel Cuevas, am serving as a Education administrator for Doctor Daniel Cuevas   Chief Complaint: right ankle swelling   HPI:    12/05/2021 Patient is a 67 year old male complaining of right ankle swelling. Patient states that his ankle is swollen been going on for 3-4 weeks doesn't remember a MOI is in the process of moving could have bumped it on something, no tingling, has pain that goes up the malleolus , no tylenol or meds for the pain , intermittent pain,    12/26/2021 Patient states that he is still swollen , meloxicam helped with the pain    02/21/2022 Patient states that he is still not feeling better, he has a constant limp, can't get a full stride, CSI did help, 3-4 weeks after he noticed the swelling was back , hasn't been able to get back to full activity , right knee swells occasionally in the am and then the swelling will go away    03/21/2022 Patient states that he is the same , R knee is still swollen    04/10/2022 Patient states ankle I a lot better, knee has declined and very painful a lot of swelling , decreased ROM   05/01/2022 Patient states   Relevant Historical Information: None pertinent    Additional pertinent review of systems negative.   Current Outpatient Medications:    clotrimazole-betamethasone (LOTRISONE) cream, Apply 1 application topically 2 (two) times daily., Disp: 30 g, Rfl: 1   diphenhydrAMINE (BENADRYL) 25 MG tablet, Take 25 mg by mouth every 6 (six) hours as needed.  , Disp: , Rfl:    DYMISTA 137-50 MCG/ACT SUSP, SPRAY 1 SPRAY INTO EACH NOSTRIL TWICE A DAY, Disp: , Rfl: 3   fluticasone (FLONASE) 50 MCG/ACT nasal spray, Place 2 sprays into the nose  daily., Disp: 48 g, Rfl: 3   levocetirizine (XYZAL) 5 MG tablet, Take 5 mg by mouth every evening., Disp: , Rfl:    meloxicam (MOBIC) 15 MG tablet, Take 1 tablet (15 mg total) by mouth daily., Disp: 30 tablet, Rfl: 0   meloxicam (MOBIC) 15 MG tablet, Take 1 tablet (15 mg total) by mouth daily., Disp: 30 tablet, Rfl: 0   methylPREDNISolone (MEDROL DOSEPAK) 4 MG TBPK tablet, Take 6 tablets on day 1.  Take 5 tablets on day 2.  Take 4 tablets on day 3.  Take 3 tablets on day 4.  Take 2 tablets on day 5.  Take 1 tablet on day 6., Disp: 21 tablet, Rfl: 0   montelukast (SINGULAIR) 10 MG tablet, Take 10 mg by mouth at bedtime., Disp: , Rfl:    Multiple Vitamin (MULTIVITAMIN) tablet, Take 1 tablet by mouth daily.  , Disp: , Rfl:    Olopatadine HCl 0.2 % SOLN, Apply 1 drop to eye daily as needed., Disp: , Rfl:    Objective:     There were no vitals filed for this visit.    There is no height or weight on file to calculate BMI.    Physical Exam:    ***   Electronically signed by:  Daniel Cuevas D.[facility] 7:37 AM 04/30/22

## 2022-05-01 ENCOUNTER — Ambulatory Visit: Payer: BC Managed Care – PPO | Admitting: Sports Medicine

## 2022-05-01 VITALS — BP 120/80 | HR 54 | Ht 71.0 in | Wt 188.0 lb

## 2022-05-01 DIAGNOSIS — M25571 Pain in right ankle and joints of right foot: Secondary | ICD-10-CM

## 2022-05-01 DIAGNOSIS — G8929 Other chronic pain: Secondary | ICD-10-CM

## 2022-05-01 DIAGNOSIS — M25461 Effusion, right knee: Secondary | ICD-10-CM | POA: Diagnosis not present

## 2022-05-01 DIAGNOSIS — M2550 Pain in unspecified joint: Secondary | ICD-10-CM

## 2022-05-01 DIAGNOSIS — M25561 Pain in right knee: Secondary | ICD-10-CM

## 2022-05-01 LAB — COMPREHENSIVE METABOLIC PANEL
ALT: 13 U/L (ref 0–53)
AST: 20 U/L (ref 0–37)
Albumin: 4.3 g/dL (ref 3.5–5.2)
Alkaline Phosphatase: 67 U/L (ref 39–117)
BUN: 18 mg/dL (ref 6–23)
CO2: 30 mEq/L (ref 19–32)
Calcium: 9.7 mg/dL (ref 8.4–10.5)
Chloride: 102 mEq/L (ref 96–112)
Creatinine, Ser: 1.16 mg/dL (ref 0.40–1.50)
GFR: 66.52 mL/min (ref >60.00)
Glucose, Bld: 80 mg/dL (ref 70–99)
Potassium: 4.5 mEq/L (ref 3.5–5.1)
Sodium: 139 mEq/L (ref 135–145)
Total Bilirubin: 0.6 mg/dL (ref 0.2–1.2)
Total Protein: 7.3 g/dL (ref 6.0–8.3)

## 2022-05-01 LAB — CBC WITH DIFFERENTIAL/PLATELET
Basophils Absolute: 0.1 10*3/uL (ref 0.0–0.1)
Basophils Relative: 0.7 % (ref 0.0–3.0)
Eosinophils Absolute: 0.1 10*3/uL (ref 0.0–0.7)
Eosinophils Relative: 1.7 % (ref 0.0–5.0)
HCT: 43.8 % (ref 39.0–52.0)
Hemoglobin: 14.7 g/dL (ref 13.0–17.0)
Lymphocytes Relative: 30.2 % (ref 12.0–46.0)
Lymphs Abs: 2.1 10*3/uL (ref 0.7–4.0)
MCHC: 33.5 g/dL (ref 30.0–36.0)
MCV: 93.6 fl (ref 78.0–100.0)
Monocytes Absolute: 0.7 10*3/uL (ref 0.1–1.0)
Monocytes Relative: 9.9 % (ref 3.0–12.0)
Neutro Abs: 4 10*3/uL (ref 1.4–7.7)
Neutrophils Relative %: 57.5 % (ref 43.0–77.0)
Platelets: 295 10*3/uL (ref 150.0–400.0)
RBC: 4.67 Mil/uL (ref 4.22–5.81)
RDW: 13.4 % (ref 11.5–15.5)
WBC: 6.9 10*3/uL (ref 4.0–10.5)

## 2022-05-01 LAB — VITAMIN D 25 HYDROXY (VIT D DEFICIENCY, FRACTURES): VITD: 32.1 ng/mL (ref 30.00–100.00)

## 2022-05-01 LAB — FERRITIN: Ferritin: 151.2 ng/mL (ref 22.0–322.0)

## 2022-05-01 LAB — TSH: TSH: 6.9 u[IU]/mL — ABNORMAL HIGH (ref 0.35–5.50)

## 2022-05-01 LAB — SEDIMENTATION RATE: Sed Rate: 15 mm/hr (ref 0–20)

## 2022-05-01 LAB — URIC ACID: Uric Acid, Serum: 6.9 mg/dL (ref 4.0–7.8)

## 2022-05-01 LAB — C-REACTIVE PROTEIN: CRP: <1 mg/dL (ref 0.5–20.0)

## 2022-05-01 NOTE — Patient Instructions (Addendum)
Good to see you Labs on the way out  I will contact you with results once they all result As needed follow up

## 2022-05-02 ENCOUNTER — Ambulatory Visit: Payer: BC Managed Care – PPO | Admitting: Physical Therapy

## 2022-05-02 ENCOUNTER — Encounter: Payer: Self-pay | Admitting: Physical Therapy

## 2022-05-02 DIAGNOSIS — M25571 Pain in right ankle and joints of right foot: Secondary | ICD-10-CM

## 2022-05-02 DIAGNOSIS — G8929 Other chronic pain: Secondary | ICD-10-CM

## 2022-05-02 DIAGNOSIS — R2681 Unsteadiness on feet: Secondary | ICD-10-CM

## 2022-05-02 DIAGNOSIS — M25561 Pain in right knee: Secondary | ICD-10-CM | POA: Diagnosis not present

## 2022-05-02 DIAGNOSIS — M6281 Muscle weakness (generalized): Secondary | ICD-10-CM

## 2022-05-02 NOTE — Therapy (Signed)
OUTPATIENT PHYSICAL THERAPY TREATMENT NOTE   Patient Name: Daniel Cuevas MRN: [id number] DOB:[birth year], 67 y.o., male Today's Date: 05/02/2022  PCP: Tonia Ghent, MD   REFERRING PROVIDER: Glennon Mac, DO   PT End of Session - 05/02/22 1613     Visit Number 2    Number of Visits --   1-2x/week   Date for PT Re-Evaluation 06/21/22    Authorization Type [employer] - FOTO    PT Start Time [phone]    PT Stop Time 0456    PT Time Calculation (min) 41 min             Past Medical History:  Diagnosis Date   Allergy    History of chicken pox    MRSA infection    in past   Past Surgical History:  Procedure Laterality Date   [place]   left, meniscus   Patient Active Problem List   Diagnosis Date Noted   Thumb laceration 07/03/2016   Skin lesion 06/15/2015   Olecranon bursitis 05/16/2013   Encounter for screening colonoscopy 09/10/2012   Allergic rhinitis 05/16/2012   Fungal infection of skin 05/16/2012   Abnormal TSH 09/25/2011   Routine general medical examination at a health care facility 01/11/2011    THERAPY DIAG:  Chronic pain of right knee  Pain in right ankle and joints of right foot  Muscle weakness  Unsteadiness on feet   Rationale for Evaluation and Treatment Rehabilitation  REFERRING DIAG: Chronic pain of right knee [M25.561, G89.29], 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  Knee and ankle are doing well with no pain.  Pain:  Are you having pain? Yes Pain location: R ankle and R knee NPRS scale:  0/10 to 3/10 Aggravating factors: planting foot and changing direction Relieving factors: rest Pain description: sharp and aching Stage: Chronic Stability: getting better 24 hour pattern: worse with activity  OBJECTIVE: (objective  measures completed at initial evaluation unless otherwise dated)  DIAGNOSTIC FINDINGS:  Recent MRI ankle:   IMPRESSION: 1. Thickening of the tibialis posterior with moderate surrounding edema suggesting tendinosis with tenosynovitis. 2. Mild edema of the tibialis posterior muscle, likely sequela of chronic strain/myotendinous injury. 3. Osteochondral injury about the medial aspect of the talar dome with subchondral cystic changes and edema. 4. Small subtalar and talonavicular joint effusions. 5. Ligaments of the ankle are intact. 6. No evidence of fracture or osteonecrosis.             GENERAL OBSERVATION/GAIT:                     R hip drop in gait   SENSATION:          Light touch: Appears intact   PALPATION: Minimal TTP   MUSCLE LENGTH: Hamstrings: Right subtle restriction; Left no restriction ASLR: Right ASLR = PSLR; Left ASLR = PSLR Thomas test: Right subtle restriction; Left subtle restriction Ely's test: Right subtle restriction; Left no restriction   LE MMT:   MMT Right 04/26/2022 Left 04/26/2022  Hip flexion (L2, L3) 5 5  Knee extension (L3) 4 5  Knee flexion 4+ 5  Hip abduction 4 5  Hip extension See SL bridge See SL bridge  Hip external rotation      Hip  internal rotation      Hip adduction      Ankle dorsiflexion (L4) Heel walk c Heel walk c  Ankle plantarflexion (S1) Toe walk difficult R Toe walk C  Ankle inversion      Ankle eversion      Great Toe ext (L5)      Grossly        (Blank rows = not tested, score listed is out of 5 possible points.  N = WNL, D = diminished, C = clear for gross weakness with myotome testing, * = concordant pain with testing)   LE ROM:   ROM Right 04/26/2022 Left 04/26/2022  Hip flexion      Hip extension      Hip abduction      Hip adduction      Hip internal rotation      Hip external rotation      Knee flexion 120 130  Knee extension n n  Ankle dorsiflexion WNL WNL  Ankle plantarflexion WNL WNL  Ankle  inversion WNL WNL  Ankle eversion        (Blank rows = not tested, N = WNL, * = concordant pain with testing)   Functional Tests   Eval (04/26/2022)      Supine single leg bridge: L 30'', R 15''      Progressive balance screen (highest level completed for >/= 10''):   Feet together: 10'' Semi Tandem: R in rear 10'', L in rear 10'' Tandem: R in rear 10'', L in rear 10'' SLS: R 5'', L 10''                                                                                               LOWER EXTREMITY SPECIAL TESTS:  Knee special tests: McMurray's test: negative and Thessaly test: positive      PATIENT SURVEYS:  FOTO: 58 - >75     TODAY'S TREATMENT: Creating, reviewing, and completing below HEP     PATIENT EDUCATION:  POC, diagnosis, prognosis, HEP, and outcome measures.  Pt educated via explanation, demonstration, and handout (HEP).  Pt confirms understanding verbally.    HOME EXERCISE PROGRAM: Access Code: [email or web address] URL: [email or web address] Date: 05/02/2022 Prepared by: Shearon Balo  Exercises - Active Straight Leg Raise with Quad Set  - 1 x daily - 7 x weekly - 3 sets - 10 reps - Sidelying Hip Abduction  - 1 x daily - 7 x weekly - 3 sets - 10 reps - Single Leg Bridge  - 1 x daily - 7 x weekly - 2 sets - 10 reps - Seated Toe Towel Scrunches  - 1 x daily - 7 x weekly - 3 sets - 10 reps - Single Leg Stance  - 2 x daily - 6 x weekly - 1 sets - 3 reps - 30 hold - Tandem Stance with Eyes Closed  - 1 x daily - 7 x weekly - 1 sets - 3 reps - 30'' hold   ASTERISK SIGNS     Asterisk Signs Eval (04/26/2022) 2/21  HS, RF, hip flexor Mild restriction L>R            Direction changes Knee pain            SL bridge R 15'' with pelvic drop            MMT See chart            R SLS 5''               TREATMENT 2/21:  Therapeutic Exercise: - bike 67mwhile taking subjective and planning session with patient - heel raises on airex with tennis  ball between ankles - 3x10 - S/L bridge - 2x10 ea - BAPS board - L3 with 1 plate weighted resistance inv/ev/DF - bridge on ball with HS curl - 3x10  - side plank with clam - GTB - 2x10 - step up 3x10  Neuromuscular re-ed: - tandem on foam - 45'' bouts - tandem stance - EC - 45'' bouts   ASSESSMENT:   CLINICAL IMPRESSION: Jabez tolerated session well with no adverse reaction.  Overall progressing as expected.  He has been having some plantar pain with balance exercises, so we discussed modifying load to reduce this.  HEP updated to include balance w/ EC.    OBJECTIVE IMPAIRMENTS: Pain, knee/hip/ankle/core strength, balance, gait   ACTIVITY LIMITATIONS: HIT exercise class, deep squatting   PERSONAL FACTORS: See medical history and pertinent history     REHAB POTENTIAL: Good   CLINICAL DECISION MAKING: Stable/uncomplicated   EVALUATION COMPLEXITY: Low     GOALS:     SHORT TERM GOALS: Target date: 05/24/2022   JFillmorewill be >75% HEP compliant to improve carryover between sessions and facilitate independent management of condition   Evaluation (04/26/2022): ongoing Goal status: INITIAL     LONG TERM GOALS: Target date: 06/21/2022   JKeilenwill improve FOTO score to 75 as a proxy for functional improvement   Evaluation/Baseline (04/26/2022): 58 Goal status: INITIAL     2.  Coreyon will improve the following MMTs to >/= 5/5 to show improvement in strength:      Evaluation/Baseline (04/26/2022):    LE MMT:   MMT Right 04/26/2022 Left 04/26/2022  Hip flexion (L2, L3) 5 5  Knee extension (L3) 4 5  Knee flexion 4+ 5  Hip abduction 4 5  Hip extension See SL bridge See SL bridge  Hip external rotation      Hip internal rotation      Hip adduction      Ankle dorsiflexion (L4) Heel walk c Heel walk c  Ankle plantarflexion (S1) Toe walk difficult R Toe walk C  Ankle inversion      Ankle eversion      Great Toe ext (L5)      Grossly        (Blank rows = not tested, score  listed is out of 5 possible points.  N = WNL, D = diminished, C = clear for gross weakness with myotome testing, * = concordant pain with testing)   Goal status: INITIAL     3.  Angello will be able to maintain supine single leg bridge for 40'' as evidence of improved hip extension and core strength (norm for healthy adult 30''- 60'')    Evaluation/Baseline (04/26/2022): L 30'' R 15'' Goal status: INITIAL       4.  JAmariewill report confidence in self management of condition at time of discharge with advanced HEP   Evaluation/Baseline (04/26/2022): unable  to self manage Goal status: INITIAL     5.  Jayvian will be able to stand for >30'' in R SL stance on foam, to show a significant improvement in balance in order to reduce fall risk    Evaluation/Baseline (04/26/2022): 5'' R SLS Goal status: INITIAL         PLAN: PT FREQUENCY: 1-2x/week   PT DURATION: 8 weeks (Ending 06/21/2022)   PLANNED INTERVENTIONS: Therapeutic exercises, Aquatic therapy, Therapeutic activity, Neuro Muscular re-education, Gait training, Patient/Family education, Joint mobilization, Dry Needling, Electrical stimulation, Spinal mobilization and/or manipulation, Moist heat, Taping, Vasopneumatic device, Ionotophoresis 21m/ml Dexamethasone, and Manual therapy   PLAN FOR NEXT SESSION: balance, knee/hip/LE strengthening, ankle strengthening, gait   KKevan NyReinhartsen PT 05/02/2022, 5:02 PM

## 2022-05-03 ENCOUNTER — Other Ambulatory Visit: Payer: Self-pay | Admitting: Sports Medicine

## 2022-05-03 DIAGNOSIS — M1990 Unspecified osteoarthritis, unspecified site: Secondary | ICD-10-CM

## 2022-05-03 DIAGNOSIS — M0580 Other rheumatoid arthritis with rheumatoid factor of unspecified site: Secondary | ICD-10-CM

## 2022-05-03 LAB — RHEUMATOID FACTOR: Rheumatoid fact SerPl-aCnc: 19 IU/mL — ABNORMAL HIGH (ref <14)

## 2022-05-03 LAB — ANA: Anti Nuclear Antibody (ANA): NEGATIVE

## 2022-05-03 LAB — CYCLIC CITRUL PEPTIDE ANTIBODY, IGG: Cyclic Citrullin Peptide Ab: <16 UNITS

## 2022-05-03 NOTE — Progress Notes (Signed)
Referral to rheumatology has been placed

## 2022-05-03 NOTE — Progress Notes (Signed)
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

## 2022-05-03 NOTE — Progress Notes (Signed)
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  Reviewed lab work including negative ANA, negative anti-CCP, sed rate and CRP both WNL, and weakly positive rheumatoid factor at 19.  Under this context I feel patient does not likely have an active inflammatory process occurring.  However, patient's aspiration was consistent with an inflammatory arthropathy with elevated WBC, neutrophils, and negative culture and no crystals seen.  Based on patient's inflammatory arthropathy, and weakly positive RF, I recommend the patient establish care with rheumatology to see if they have any other recommendations or long-term screening that they would recommend for patient.  Patient verbalized understanding and had no further questions.  Elevated TSH is a known factor for patient has been present for years.  Continue seeing PCP.

## 2022-05-10 ENCOUNTER — Encounter: Payer: Self-pay | Admitting: Physical Therapy

## 2022-05-10 ENCOUNTER — Ambulatory Visit: Payer: BC Managed Care – PPO | Admitting: Physical Therapy

## 2022-05-10 DIAGNOSIS — G8929 Other chronic pain: Secondary | ICD-10-CM

## 2022-05-10 DIAGNOSIS — M25571 Pain in right ankle and joints of right foot: Secondary | ICD-10-CM

## 2022-05-10 DIAGNOSIS — R2681 Unsteadiness on feet: Secondary | ICD-10-CM

## 2022-05-10 DIAGNOSIS — M6281 Muscle weakness (generalized): Secondary | ICD-10-CM

## 2022-05-10 DIAGNOSIS — M25561 Pain in right knee: Secondary | ICD-10-CM | POA: Diagnosis not present

## 2022-05-10 NOTE — Therapy (Addendum)
PHYSICAL THERAPY UNPLANNED DISCHARGE SUMMARY   Visits from Start of Care: 3  Current functional level related to goals / functional outcomes: Current status unknown   Remaining deficits: Current status unknown   Education / Equipment: Pt has not returned since visit listed below  Patient goals were not assessed. Patient is being discharged due to not returning since the last visit.  (the below note was addended to include the above D/C summary on 06/19/22)  OUTPATIENT PHYSICAL THERAPY TREATMENT NOTE   Patient Name: Daniel Cuevas MRN: [id number] DOB:[birth year], 67 y.o., male Today's Date: 05/10/2022  PCP: Joaquim Nam, MD   REFERRING PROVIDER: Richardean Sale, DO   PT End of Session - 05/10/22 0746     Visit Number 3    Number of Visits --   1-2x/week   Date for PT Re-Evaluation 06/21/22    Authorization Type [employer] - FOTO    PT Start Time 0745    PT Stop Time 0826    PT Time Calculation (min) 41 min              Past Medical History:  Diagnosis Date   Allergy    History of chicken pox    MRSA infection    in past   Past Surgical History:  Procedure Laterality Date   KNEE SURGERY  1995   left, meniscus   Patient Active Problem List   Diagnosis Date Noted   Thumb laceration 07/03/2016   Skin lesion 06/15/2015   Olecranon bursitis 05/16/2013   Encounter for screening colonoscopy 09/10/2012   Allergic rhinitis 05/16/2012   Fungal infection of skin 05/16/2012   Abnormal TSH 09/25/2011   Routine general medical examination at a health care facility 01/11/2011    THERAPY DIAG:  Chronic pain of right knee  Pain in right ankle and joints of right foot  Muscle weakness  Unsteadiness on feet   Rationale for Evaluation and Treatment Rehabilitation  REFERRING DIAG: Chronic pain of right knee [M25.561, G89.29], 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  Pain:  Are you having pain? Yes Pain location: R ankle and R knee NPRS scale:  0/10 to 3/10 Aggravating factors: planting foot and changing direction Relieving factors: rest Pain description: sharp and aching Stage: Chronic Stability: getting better 24 hour pattern: worse with activity  OBJECTIVE: (objective measures completed at initial evaluation unless otherwise dated)  DIAGNOSTIC FINDINGS:  Recent MRI ankle:   IMPRESSION: 1. Thickening of the tibialis posterior with moderate surrounding edema suggesting tendinosis with tenosynovitis. 2. Mild edema of the tibialis posterior muscle, likely sequela of chronic strain/myotendinous injury. 3. Osteochondral injury about the medial aspect of the talar dome with subchondral cystic changes and edema. 4. Small subtalar and talonavicular joint effusions. 5. Ligaments of the ankle are intact. 6. No evidence of fracture or osteonecrosis.             GENERAL OBSERVATION/GAIT:                     R hip drop in gait   SENSATION:          Light touch: Appears intact   PALPATION: Minimal TTP   MUSCLE LENGTH: Hamstrings: Right subtle  restriction; Left no restriction ASLR: Right ASLR = PSLR; Left ASLR = PSLR Thomas test: Right subtle restriction; Left subtle restriction Ely's test: Right subtle restriction; Left no restriction   LE MMT:   MMT Right 04/26/2022 Left 04/26/2022  Hip flexion (L2, L3) 5 5  Knee extension (L3) 4 5  Knee flexion 4+ 5  Hip abduction 4 5  Hip extension See SL bridge See SL bridge  Hip external rotation      Hip internal rotation      Hip adduction      Ankle dorsiflexion (L4) Heel walk c Heel walk c  Ankle plantarflexion (S1) Toe walk difficult R Toe walk C  Ankle inversion      Ankle eversion       Great Toe ext (L5)      Grossly        (Blank rows = not tested, score listed is out of 5 possible points.  N = WNL, D = diminished, C = clear for gross weakness with myotome testing, * = concordant pain with testing)   LE ROM:   ROM Right 04/26/2022 Left 04/26/2022  Hip flexion      Hip extension      Hip abduction      Hip adduction      Hip internal rotation      Hip external rotation      Knee flexion 120 130  Knee extension n n  Ankle dorsiflexion WNL WNL  Ankle plantarflexion WNL WNL  Ankle inversion WNL WNL  Ankle eversion        (Blank rows = not tested, N = WNL, * = concordant pain with testing)   Functional Tests   Eval (04/26/2022)      Supine single leg bridge: L 30'', R 15''      Progressive balance screen (highest level completed for >/= 10''):   Feet together: 10'' Semi Tandem: R in rear 10'', L in rear 10'' Tandem: R in rear 10'', L in rear 10'' SLS: R 5'', L 10''                                                                                               LOWER EXTREMITY SPECIAL TESTS:  Knee special tests: McMurray's test: negative and Thessaly test: positive      PATIENT SURVEYS:  FOTO: 58 - >75     TODAY'S TREATMENT: Creating, reviewing, and completing below HEP     PATIENT EDUCATION:  POC, diagnosis, prognosis, HEP, and outcome measures.  Pt educated via explanation, demonstration, and handout (HEP).  Pt confirms understanding verbally.    HOME EXERCISE PROGRAM: Access Code: [email or web address] URL: [email or web address] Date: 05/02/2022 Prepared by: Alphonzo Severance  Exercises - Active Straight Leg Raise with Quad Set  - 1 x daily - 7 x weekly - 3 sets - 10 reps - Sidelying Hip Abduction  - 1 x daily - 7 x weekly - 3 sets - 10 reps - Single Leg Bridge  - 1 x daily - 7 x weekly - 2 sets - 10 reps - Seated Toe Towel Scrunches  -  1 x daily - 7 x weekly - 3 sets - 10 reps - Single Leg Stance  - 2 x daily - 6 x weekly - 1  sets - 3 reps - 30 hold - Tandem Stance with Eyes Closed  - 1 x daily - 7 x weekly - 1 sets - 3 reps - 30'' hold   ASTERISK SIGNS     Asterisk Signs Eval (04/26/2022) 2/21           HS, RF, hip flexor Mild restriction L>R            Direction changes Knee pain            SL bridge R 15'' with pelvic drop            MMT See chart            R SLS 5''               TREATMENT 2/29:  Therapeutic Exercise: - bike 69m while taking subjective and planning session with patient - heel raises on 4'' with tennis ball between ankles - 3x10 - plantar flexion stretch with sheet - 45'' x3 - bridge with back elevated on bolster - 3x10 - 15# - BAPS board - L4 with 1 plate weighted resistance inv/ev/DF - step up 3x10 with march - 4''  Manual Therapy - STM plantar surface of R foot - met mobilization R foot   TREATMENT 2/21:  Therapeutic Exercise: - bike 37m while taking subjective and planning session with patient - heel raises on airex with tennis ball between ankles - 3x10 - S/L bridge - 2x10 ea - BAPS board - L3 with 1 plate weighted resistance inv/ev/DF - bridge on ball with HS curl - 3x10  - side plank with clam - GTB - 2x10 - step up 3x10  Neuromuscular re-ed: - tandem on foam - 45'' bouts - tandem stance - EC - 45'' bouts   ASSESSMENT:   CLINICAL IMPRESSION: Rasheem tolerated session well with no adverse reaction.  Overall progressing as expected.  Advanced BAPS board ROM - this is challenging but completed with good form with PT supporting pts knee to avoid excessive motion.  MT utilized to reduce PF discomfort.    OBJECTIVE IMPAIRMENTS: Pain, knee/hip/ankle/core strength, balance, gait   ACTIVITY LIMITATIONS: HIT exercise class, deep squatting   PERSONAL FACTORS: See medical history and pertinent history     REHAB POTENTIAL: Good   CLINICAL DECISION MAKING: Stable/uncomplicated   EVALUATION COMPLEXITY: Low     GOALS:     SHORT TERM GOALS: Target date: 05/24/2022    Conn will be >75% HEP compliant to improve carryover between sessions and facilitate independent management of condition   Evaluation (04/26/2022): ongoing Goal status: INITIAL     LONG TERM GOALS: Target date: 06/21/2022   Aaric will improve FOTO score to 75 as a proxy for functional improvement   Evaluation/Baseline (04/26/2022): 58 Goal status: INITIAL     2.  Kamal will improve the following MMTs to >/= 5/5 to show improvement in strength:      Evaluation/Baseline (04/26/2022):    LE MMT:   MMT Right 04/26/2022 Left 04/26/2022  Hip flexion (L2, L3) 5 5  Knee extension (L3) 4 5  Knee flexion 4+ 5  Hip abduction 4 5  Hip extension See SL bridge See SL bridge  Hip external rotation      Hip internal rotation      Hip adduction  Ankle dorsiflexion (L4) Heel walk c Heel walk c  Ankle plantarflexion (S1) Toe walk difficult R Toe walk C  Ankle inversion      Ankle eversion      Great Toe ext (L5)      Grossly        (Blank rows = not tested, score listed is out of 5 possible points.  N = WNL, D = diminished, C = clear for gross weakness with myotome testing, * = concordant pain with testing)   Goal status: INITIAL     3.  Ishmael will be able to maintain supine single leg bridge for 40'' as evidence of improved hip extension and core strength (norm for healthy adult 30''- 60'')    Evaluation/Baseline (04/26/2022): L 30'' R 15'' Goal status: INITIAL       4.  Efstathios will report confidence in self management of condition at time of discharge with advanced HEP   Evaluation/Baseline (04/26/2022): unable to self manage Goal status: INITIAL     5.  Gianny will be able to stand for >30'' in R SL stance on foam, to show a significant improvement in balance in order to reduce fall risk    Evaluation/Baseline (04/26/2022): 5'' R SLS Goal status: INITIAL         PLAN: PT FREQUENCY: 1-2x/week   PT DURATION: 8 weeks (Ending 06/21/2022)   PLANNED INTERVENTIONS: Therapeutic  exercises, Aquatic therapy, Therapeutic activity, Neuro Muscular re-education, Gait training, Patient/Family education, Joint mobilization, Dry Needling, Electrical stimulation, Spinal mobilization and/or manipulation, Moist heat, Taping, Vasopneumatic device, Ionotophoresis 4mg /ml Dexamethasone, and Manual therapy   PLAN FOR NEXT SESSION: balance, knee/hip/LE strengthening, ankle strengthening, gait   Kimberlee Nearing Libni Fusaro PT 05/10/2022, 8:32 AM

## 2022-05-17 ENCOUNTER — Ambulatory Visit: Payer: BC Managed Care – PPO | Admitting: Physical Therapy

## 2022-05-24 ENCOUNTER — Ambulatory Visit: Payer: BC Managed Care – PPO | Admitting: Physical Therapy

## 2022-05-31 ENCOUNTER — Ambulatory Visit: Payer: BC Managed Care – PPO | Admitting: Physical Therapy

## 2022-06-13 NOTE — Progress Notes (Unsigned)
Benito Mccreedy D.[place] Phone: [phone]   Assessment and Plan:    1. Polyarthritis with positive rheumatoid factor 2. Inflammatory arthritis 3. Chronic pain of right knee 4. Effusion of right knee 5. Recurrent right knee instability  -Chronic with exacerbation, subsequent sports medicine visit - Recurrence of right knee pain, effusion, instability without new MOI - Based on patient's recurrence of pain, positive ANA and rheumatoid factor, I suspect patient is likely experiencing a flare of rheumatoid arthritis - Patient elected for aspiration of knee due to effusion and instability.  We will not inject corticosteroid at today's visit as it is only been 2 months since previous injection - Start prednisone 20 mg daily for 10 days. - Recommend establishing care with rheumatology.  The earliest [facility] rheumatology can see patient is currently in 09/2022.  We have provided external referral so patient can go to other local rheumatologist that could see him sooner. - Due to failure to improve with greater than 6 weeks of conservative therapy, recurrent knee pain/effusion/instability, pain affecting day-to-day activities, pain frequently >6/10, we will proceed with right knee MRI.  Procedure: Ultrasound Guided Knee Joint Injection/Aspiration Side: right Diagnosis: Right knee effusion, instability [place] Indication:  - accuracy is paramount for diagnosis - to ensure therapeutic efficacy or procedural success - to reduce procedural risk  Risks explained and consent was given verbally. The site was cleaned with Chlorhexidine. The suprapatellar pouch of the knee was identified.  A superficial wheal and numbing track was made using 25-gauge needle and 5 mL 1% lidocaine.  An 18-gauge needle was introduced to the pouch under ultrasound guidance. 119mL of  clear/straw colored synovial fluid was aspirated. Syringe was  exchanged and injection given using 5mL of 1% lidocaine without epinephrine and 57mL of kenalog 40mg /ml. This was well tolerated and resulted in symptomatic relief.  Needle was removed, hemostasis achieved, and post injection instructions were explained.   Pt was advised to call or return to clinic if these symptoms worsen or fail to improve as anticipated.   Pertinent previous records reviewed include none   Follow Up: 3 days after MRI to review results and discuss treatment plan.   Subjective:   I, Pincus Badder, am serving as a Education administrator for Doctor Glennon Mac   Chief Complaint: right ankle swelling   HPI:    12/05/2021 Patient is a 67 year old male complaining of right ankle swelling. Patient states that his ankle is swollen been going on for 3-4 weeks doesn't remember a MOI is in the process of moving could have bumped it on something, no tingling, has pain that goes up the malleolus , no tylenol or meds for the pain , intermittent pain,    12/26/2021 Patient states that he is still swollen , meloxicam helped with the pain    02/21/2022 Patient states that he is still not feeling better, he has a constant limp, can't get a full stride, CSI did help, 3-4 weeks after he noticed the swelling was back , hasn't been able to get back to full activity , right knee swells occasionally in the am and then the swelling will go away    03/21/2022 Patient states that he is the same , R knee is still swollen    04/10/2022 Patient states ankle I a lot better, knee has declined and very painful a lot of swelling , decreased ROM    05/01/2022 Patient states he is  alright , progressing in the right direction , still a little pain in the ankle from rehab   06/14/2022 Patient states knee has flared again and his knee almost buckled on him and is more painful happened this morning , rheumatology is back up, will print off referral to rheumatology, would like to drain and or MRI      Relevant  Historical Information: None pertinent  Additional pertinent review of systems negative.   Current Outpatient Medications:    clotrimazole-betamethasone (LOTRISONE) cream, Apply 1 application topically 2 (two) times daily., Disp: 30 g, Rfl: 1   diphenhydrAMINE (BENADRYL) 25 MG tablet, Take 25 mg by mouth every 6 (six) hours as needed.  , Disp: , Rfl:    DYMISTA 137-50 MCG/ACT SUSP, SPRAY 1 SPRAY INTO EACH NOSTRIL TWICE A DAY, Disp: , Rfl: 3   levocetirizine (XYZAL) 5 MG tablet, Take 5 mg by mouth every evening., Disp: , Rfl:    montelukast (SINGULAIR) 10 MG tablet, Take 10 mg by mouth at bedtime., Disp: , Rfl:    Multiple Vitamin (MULTIVITAMIN) tablet, Take 1 tablet by mouth daily.  , Disp: , Rfl:    predniSONE (DELTASONE) 50 MG tablet, Take one tablet daily for the next 5 days., Disp: 5 tablet, Rfl: 0   fluticasone (FLONASE) 50 MCG/ACT nasal spray, Place 2 sprays into the nose daily., Disp: 48 g, Rfl: 3   meloxicam (MOBIC) 15 MG tablet, Take 1 tablet (15 mg total) by mouth daily., Disp: 30 tablet, Rfl: 0   meloxicam (MOBIC) 15 MG tablet, Take 1 tablet (15 mg total) by mouth daily., Disp: 30 tablet, Rfl: 0   methylPREDNISolone (MEDROL DOSEPAK) 4 MG TBPK tablet, Take 6 tablets on day 1.  Take 5 tablets on day 2.  Take 4 tablets on day 3.  Take 3 tablets on day 4.  Take 2 tablets on day 5.  Take 1 tablet on day 6., Disp: 21 tablet, Rfl: 0   Olopatadine HCl 0.2 % SOLN, Apply 1 drop to eye daily as needed., Disp: , Rfl:    Objective:     Vitals:   06/14/22 1303  Pulse: 73  SpO2: 98%  Weight: 188 lb (85.3 kg)  Height: 5\' 11"  (1.803 m)      Body mass index is 26.22 kg/m.    Physical Exam:    General:  awake, alert oriented, no acute distress nontoxic Skin: no suspicious lesions or rashes Neuro:sensation intact and strength 5/5 with no deficits, no atrophy, normal muscle tone Psych: No signs of anxiety, depression or other mood disorder  Right knee: Large swelling No  deformity Positive fluid wave, joint milking ROM Flex 80, Ext 15 Gait antalgic, favoring left leg  Electronically signed by:  Benito Mccreedy D.[facility] 2:35 PM 06/14/22

## 2022-06-14 ENCOUNTER — Ambulatory Visit: Payer: BC Managed Care – PPO | Admitting: Sports Medicine

## 2022-06-14 ENCOUNTER — Other Ambulatory Visit: Payer: Self-pay

## 2022-06-14 VITALS — HR 73 | Ht 71.0 in | Wt 188.0 lb

## 2022-06-14 DIAGNOSIS — M25461 Effusion, right knee: Secondary | ICD-10-CM

## 2022-06-14 DIAGNOSIS — M2351 Chronic instability of knee, right knee: Secondary | ICD-10-CM

## 2022-06-14 DIAGNOSIS — M25561 Pain in right knee: Secondary | ICD-10-CM | POA: Diagnosis not present

## 2022-06-14 DIAGNOSIS — M0580 Other rheumatoid arthritis with rheumatoid factor of unspecified site: Secondary | ICD-10-CM

## 2022-06-14 DIAGNOSIS — M1990 Unspecified osteoarthritis, unspecified site: Secondary | ICD-10-CM | POA: Diagnosis not present

## 2022-06-14 DIAGNOSIS — G8929 Other chronic pain: Secondary | ICD-10-CM

## 2022-06-14 MED ORDER — PREDNISONE 50 MG PO TABS: ORAL_TABLET | ORAL | 0 refills | Status: AC

## 2022-06-14 NOTE — Patient Instructions (Addendum)
Good to see you Right knee MRI  Prednisone 20 mg daily for 10 days  Follow up 3 days after to discuss results

## 2022-06-19 ENCOUNTER — Ambulatory Visit (INDEPENDENT_AMBULATORY_CARE_PROVIDER_SITE_OTHER): Payer: BC Managed Care – PPO

## 2022-06-19 DIAGNOSIS — M1990 Unspecified osteoarthritis, unspecified site: Secondary | ICD-10-CM

## 2022-06-19 DIAGNOSIS — M2351 Chronic instability of knee, right knee: Secondary | ICD-10-CM

## 2022-06-19 DIAGNOSIS — M25561 Pain in right knee: Secondary | ICD-10-CM

## 2022-06-19 DIAGNOSIS — M25461 Effusion, right knee: Secondary | ICD-10-CM

## 2022-06-19 DIAGNOSIS — G8929 Other chronic pain: Secondary | ICD-10-CM | POA: Diagnosis not present

## 2022-06-19 DIAGNOSIS — M0580 Other rheumatoid arthritis with rheumatoid factor of unspecified site: Secondary | ICD-10-CM

## 2022-06-20 NOTE — Progress Notes (Signed)
Daniel Cuevas D.[facility] [address] [place] [address] Phone: [phone]   Assessment and Plan:     There are no diagnoses linked to this encounter.  ***   Pertinent previous records reviewed include ***   Follow Up: ***     Subjective:   I, [name], am serving as a Neurosurgeon for Doctor Richardean Sale   Chief Complaint: right ankle swelling   HPI:    12/05/2021 Patient is a 67 year old male complaining of right ankle swelling. Patient states that his ankle is swollen been going on for 3-4 weeks doesn't remember a MOI is in the process of moving could have bumped it on something, no tingling, has pain that goes up the malleolus , no tylenol or meds for the pain , intermittent pain,    12/26/2021 Patient states that he is still swollen , meloxicam helped with the pain    02/21/2022 Patient states that he is still not feeling better, he has a constant limp, can't get a full stride, CSI did help, 3-4 weeks after he noticed the swelling was back , hasn't been able to get back to full activity , right knee swells occasionally in the am and then the swelling will go away    03/21/2022 Patient states that he is the same , R knee is still swollen    04/10/2022 Patient states ankle I a lot better, knee has declined and very painful a lot of swelling , decreased ROM    05/01/2022 Patient states he is alright , progressing in the right direction , still a little pain in the ankle from rehab    06/14/2022 Patient states knee has flared again and his knee almost buckled on him and is more painful happened this morning , rheumatology is back up, will print off referral to rheumatology, would like to drain and or MRI   06/21/2022 Patient states     Relevant Historical Information: None pertinent  Additional pertinent review of systems negative.   Current Outpatient Medications:    clotrimazole-betamethasone (LOTRISONE) cream,  Apply 1 application topically 2 (two) times daily., Disp: 30 g, Rfl: 1   diphenhydrAMINE (BENADRYL) 25 MG tablet, Take 25 mg by mouth every 6 (six) hours as needed.  , Disp: , Rfl:    DYMISTA 137-50 MCG/ACT SUSP, SPRAY 1 SPRAY INTO EACH NOSTRIL TWICE A DAY, Disp: , Rfl: 3   fluticasone (FLONASE) 50 MCG/ACT nasal spray, Place 2 sprays into the nose daily., Disp: 48 g, Rfl: 3   levocetirizine (XYZAL) 5 MG tablet, Take 5 mg by mouth every evening., Disp: , Rfl:    meloxicam (MOBIC) 15 MG tablet, Take 1 tablet (15 mg total) by mouth daily., Disp: 30 tablet, Rfl: 0   meloxicam (MOBIC) 15 MG tablet, Take 1 tablet (15 mg total) by mouth daily., Disp: 30 tablet, Rfl: 0   methylPREDNISolone (MEDROL DOSEPAK) 4 MG TBPK tablet, Take 6 tablets on day 1.  Take 5 tablets on day 2.  Take 4 tablets on day 3.  Take 3 tablets on day 4.  Take 2 tablets on day 5.  Take 1 tablet on day 6., Disp: 21 tablet, Rfl: 0   montelukast (SINGULAIR) 10 MG tablet, Take 10 mg by mouth at bedtime., Disp: , Rfl:    Multiple Vitamin (MULTIVITAMIN) tablet, Take 1 tablet by mouth daily.  , Disp: , Rfl:    Olopatadine HCl 0.2 % SOLN, Apply 1 drop to  eye daily as needed., Disp: , Rfl:    predniSONE (DELTASONE) 50 MG tablet, Take one tablet daily for the next 5 days., Disp: 5 tablet, Rfl: 0   Objective:     There were no vitals filed for this visit.    There is no height or weight on file to calculate BMI.    Physical Exam:    ***   Electronically signed by:  Daniel Cuevas D.[facility] 7:39 AM 06/20/22

## 2022-06-21 ENCOUNTER — Encounter: Payer: Self-pay | Admitting: Sports Medicine

## 2022-06-21 ENCOUNTER — Ambulatory Visit: Payer: BC Managed Care – PPO | Admitting: Sports Medicine

## 2022-06-21 VITALS — BP 136/84 | HR 70 | Ht 71.0 in | Wt 191.2 lb

## 2022-06-21 DIAGNOSIS — M1990 Unspecified osteoarthritis, unspecified site: Secondary | ICD-10-CM | POA: Diagnosis not present

## 2022-06-21 DIAGNOSIS — M0580 Other rheumatoid arthritis with rheumatoid factor of unspecified site: Secondary | ICD-10-CM | POA: Diagnosis not present

## 2022-06-21 DIAGNOSIS — M25561 Pain in right knee: Secondary | ICD-10-CM

## 2022-06-21 DIAGNOSIS — M25461 Effusion, right knee: Secondary | ICD-10-CM

## 2022-06-21 DIAGNOSIS — G8929 Other chronic pain: Secondary | ICD-10-CM

## 2022-09-12 ENCOUNTER — Encounter: Payer: BC Managed Care – PPO | Admitting: Internal Medicine

## 2022-10-18 ENCOUNTER — Encounter: Payer: BC Managed Care – PPO | Admitting: Rheumatology

## 2022-11-08 ENCOUNTER — Ambulatory Visit: Payer: BC Managed Care – PPO | Admitting: Rheumatology

## 2023-11-03 IMAGING — DX DG KNEE AP/LAT W/ SUNRISE*R*
3 series · 3 of 3 positions shown · non-contrast
Comparison: None Available.

CLINICAL DATA: Right knee pain and swelling for 2-3 weeks.

EXAM:
RIGHT KNEE 3 VIEWS

[knee ap]
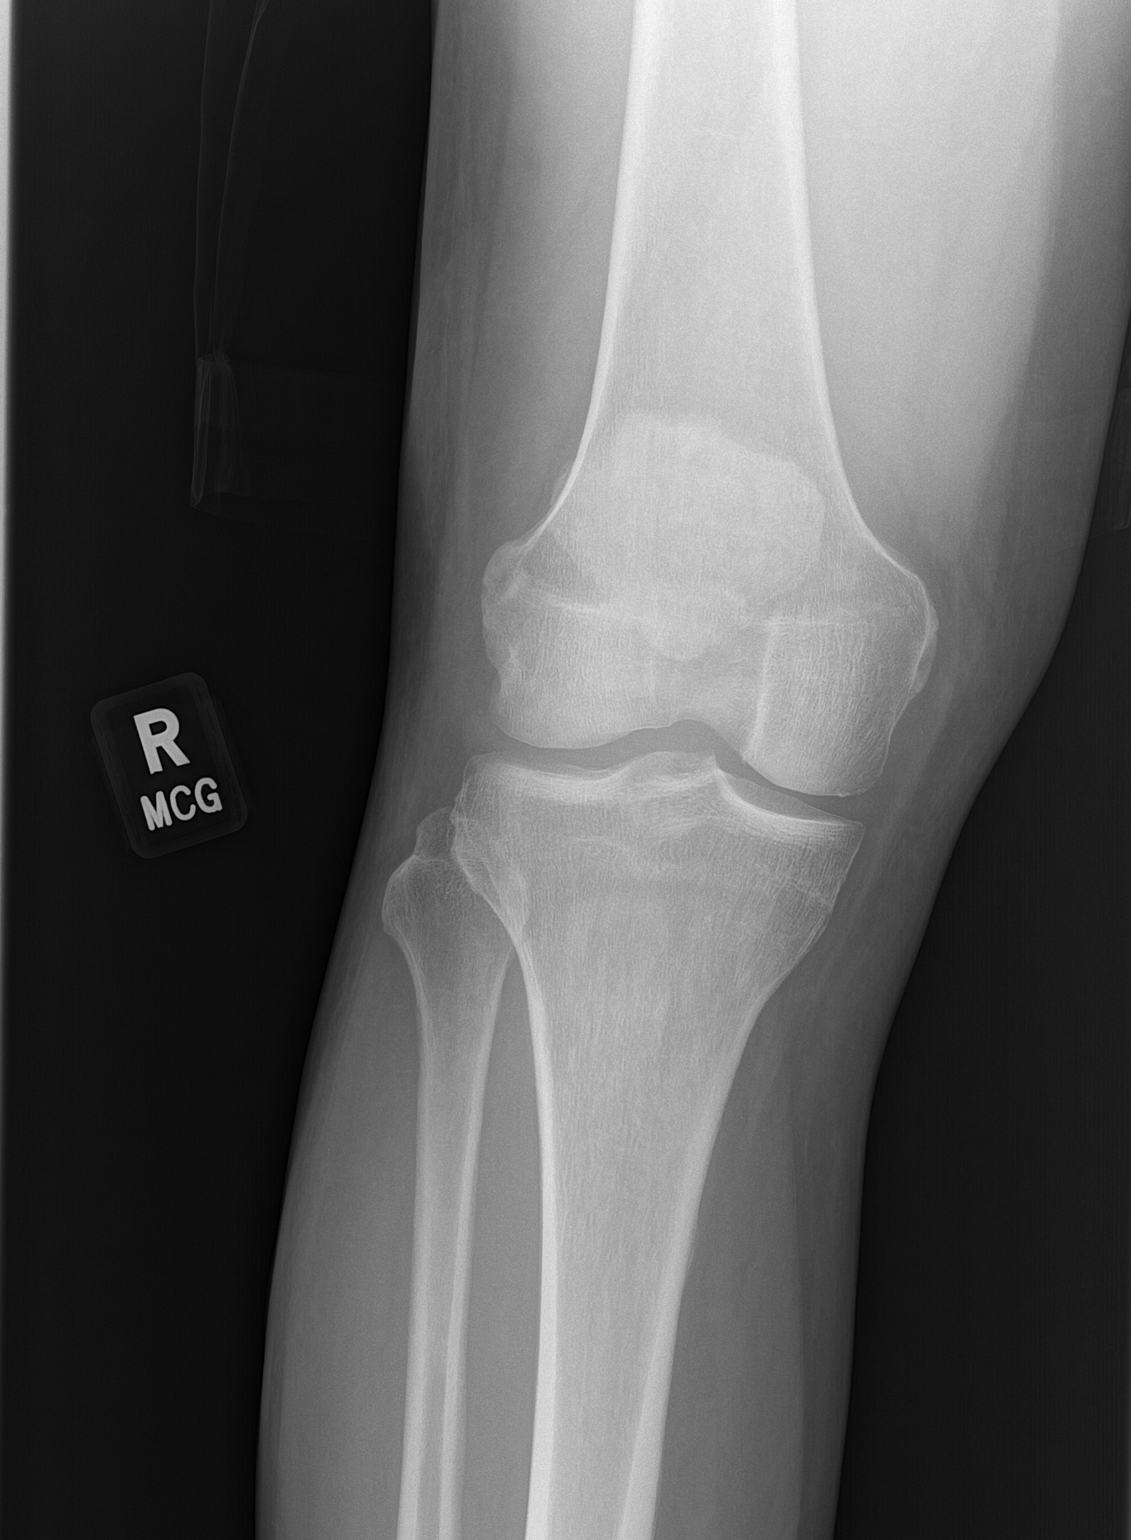

[knee lat]
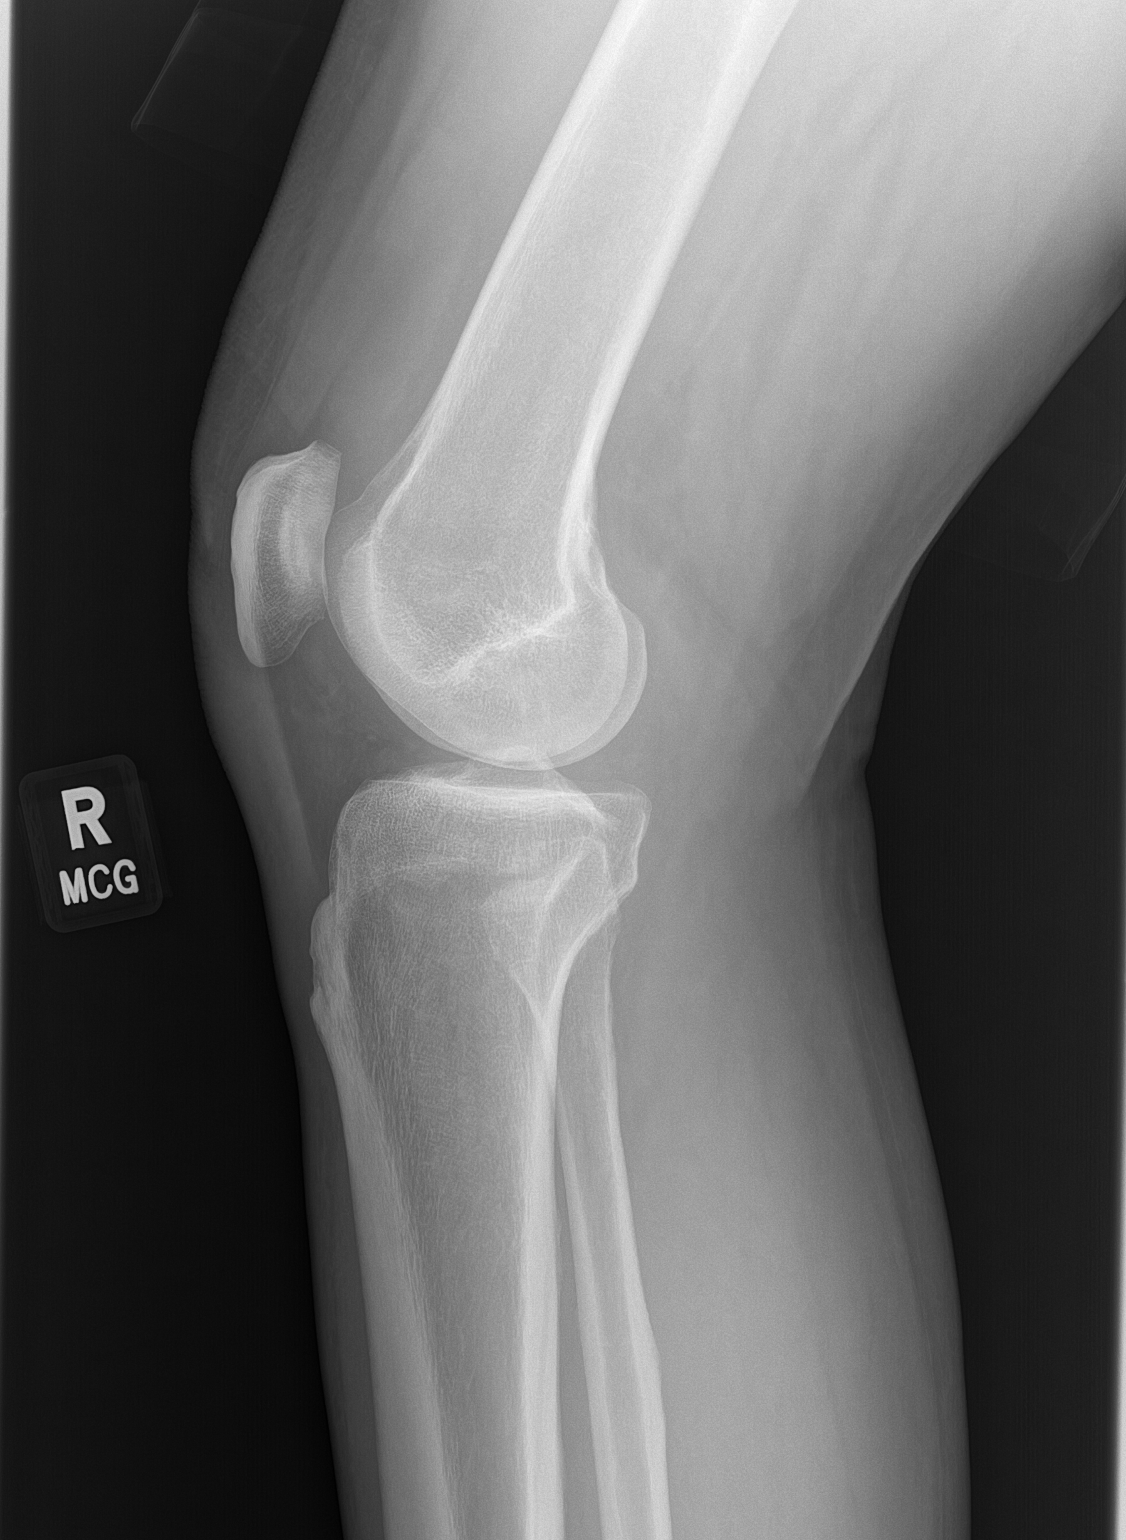

[patella]
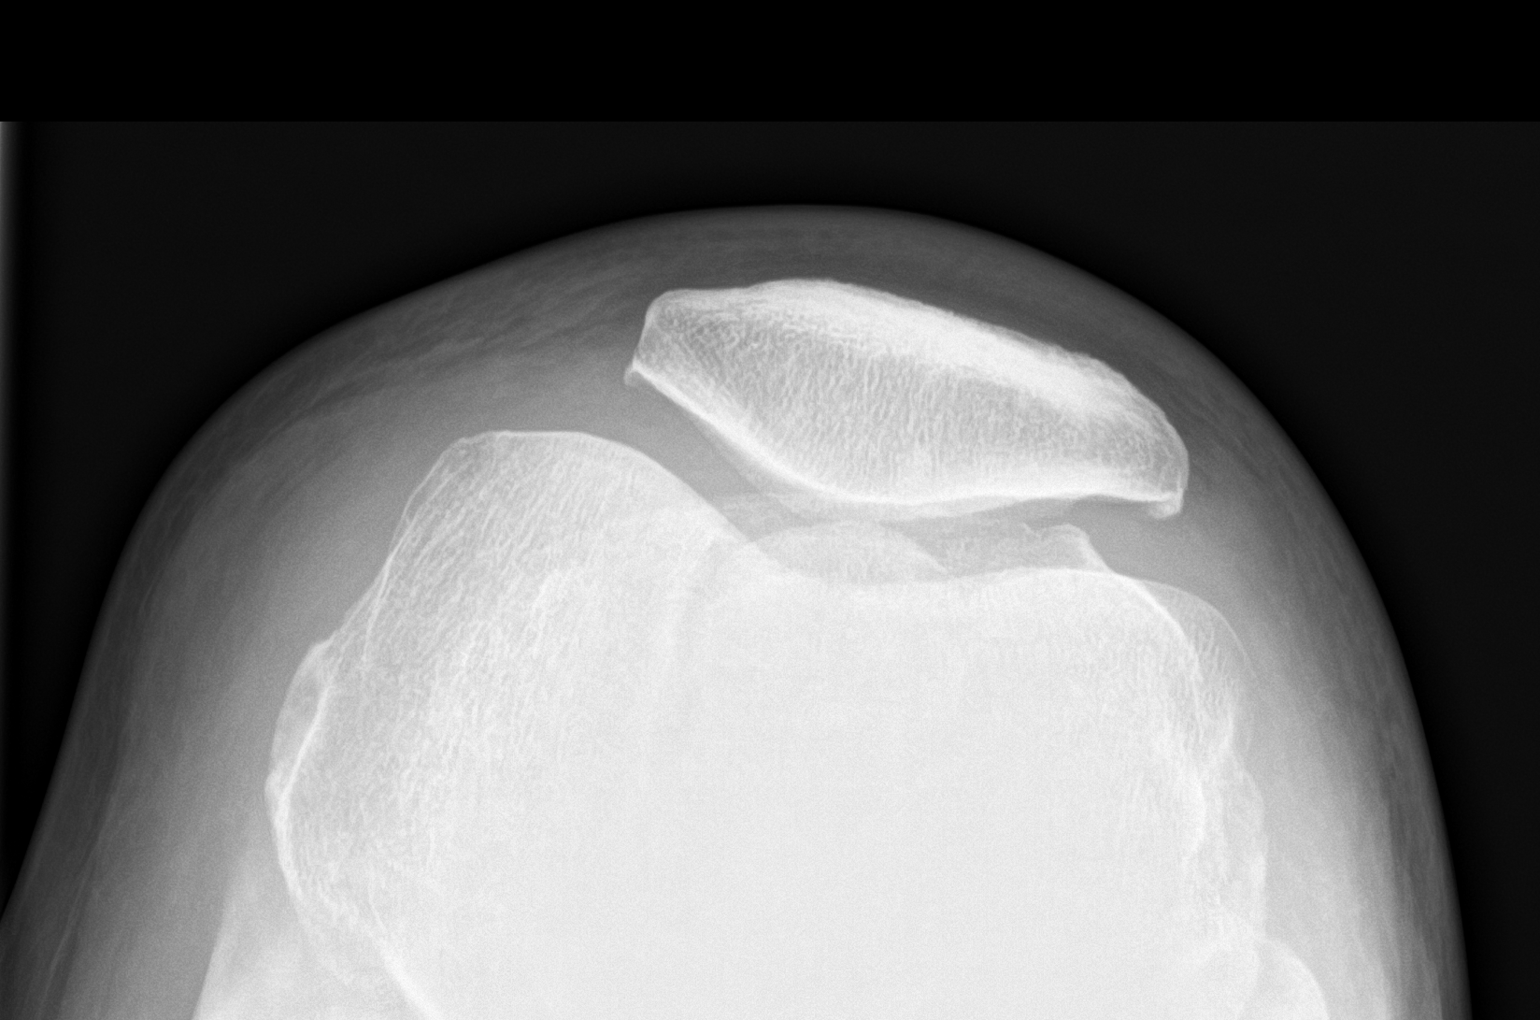

[3 of 3 positions shown; findings below may reference images not displayed]

FINDINGS: There is a small suprapatellar joint effusion. Mild peripheral
patellar degenerative spurs. Joint spaces are preserved. No acute
fracture or dislocation.
IMPRESSION: 1. Small suprapatellar joint effusion.
2. Mild peripheral patellar degenerative spurs.
